# Patient Record
Sex: Female | Born: 1960 | Race: White | Hispanic: No | Marital: Married | Smoking: Never smoker
Health system: Southern US, Community
[De-identification: ages and names within clinical notes are randomized; demographics above are authoritative.]

## PROBLEM LIST (undated history)

## (undated) DIAGNOSIS — I10 Essential (primary) hypertension: Secondary | ICD-10-CM

## (undated) DIAGNOSIS — N200 Calculus of kidney: Secondary | ICD-10-CM

## (undated) HISTORY — DX: Essential (primary) hypertension: I10

## (undated) HISTORY — DX: Calculus of kidney: N20.0

## (undated) HISTORY — PX: GALLBLADDER SURGERY: SHX652

## (undated) HISTORY — PX: ABLATION: SHX5711

## (undated) HISTORY — PX: OTHER SURGICAL HISTORY: SHX169

---

## 2000-10-23 ENCOUNTER — Other Ambulatory Visit: Admission: RE | Admit: 2000-10-23 | Discharge: 2000-10-23 | Payer: Self-pay | Admitting: Internal Medicine

## 2001-08-27 ENCOUNTER — Ambulatory Visit (HOSPITAL_COMMUNITY): Admission: RE | Admit: 2001-08-27 | Discharge: 2001-08-27 | Payer: Self-pay | Admitting: Family Medicine

## 2001-08-27 ENCOUNTER — Encounter: Payer: Self-pay | Admitting: Family Medicine

## 2004-09-20 ENCOUNTER — Emergency Department (HOSPITAL_COMMUNITY): Admission: EM | Admit: 2004-09-20 | Discharge: 2004-09-20 | Payer: Self-pay | Admitting: Emergency Medicine

## 2008-10-14 ENCOUNTER — Ambulatory Visit (HOSPITAL_COMMUNITY): Admission: RE | Admit: 2008-10-14 | Discharge: 2008-10-14 | Payer: Self-pay | Admitting: Obstetrics and Gynecology

## 2008-10-14 ENCOUNTER — Encounter (HOSPITAL_COMMUNITY): Payer: Self-pay | Admitting: Obstetrics and Gynecology

## 2010-03-04 ENCOUNTER — Encounter: Payer: Self-pay | Admitting: Family Medicine

## 2010-05-18 LAB — CBC
HCT: 36.1 % (ref 36.0–46.0)
Hemoglobin: 11.9 g/dL — ABNORMAL LOW (ref 12.0–15.0)
MCHC: 32.9 g/dL (ref 30.0–36.0)
MCV: 85.6 fL (ref 78.0–100.0)
Platelets: 411 10*3/uL — ABNORMAL HIGH (ref 150–400)
RBC: 4.21 MIL/uL (ref 3.87–5.11)
RDW: 15.5 % (ref 11.5–15.5)
WBC: 8.4 10*3/uL (ref 4.0–10.5)

## 2010-05-18 LAB — TYPE AND SCREEN
ABO/RH(D): O POS
Antibody Screen: NEGATIVE

## 2010-05-18 LAB — ABO/RH: ABO/RH(D): O POS

## 2010-05-18 LAB — PREGNANCY, URINE: Preg Test, Ur: NEGATIVE

## 2014-01-05 ENCOUNTER — Other Ambulatory Visit: Payer: Self-pay | Admitting: Obstetrics and Gynecology

## 2014-01-10 LAB — CYTOLOGY - PAP

## 2016-02-01 DIAGNOSIS — J343 Hypertrophy of nasal turbinates: Secondary | ICD-10-CM | POA: Diagnosis not present

## 2016-02-01 DIAGNOSIS — Z6831 Body mass index (BMI) 31.0-31.9, adult: Secondary | ICD-10-CM | POA: Diagnosis not present

## 2016-02-01 DIAGNOSIS — Z1389 Encounter for screening for other disorder: Secondary | ICD-10-CM | POA: Diagnosis not present

## 2016-02-01 DIAGNOSIS — J329 Chronic sinusitis, unspecified: Secondary | ICD-10-CM | POA: Diagnosis not present

## 2016-02-01 DIAGNOSIS — R070 Pain in throat: Secondary | ICD-10-CM | POA: Diagnosis not present

## 2016-02-01 DIAGNOSIS — J069 Acute upper respiratory infection, unspecified: Secondary | ICD-10-CM | POA: Diagnosis not present

## 2016-07-12 ENCOUNTER — Ambulatory Visit (INDEPENDENT_AMBULATORY_CARE_PROVIDER_SITE_OTHER): Payer: BLUE CROSS/BLUE SHIELD | Admitting: Family Medicine

## 2016-07-12 ENCOUNTER — Encounter: Payer: Self-pay | Admitting: Family Medicine

## 2016-07-12 VITALS — BP 134/97 | HR 95 | Temp 98.1°F | Ht 64.5 in | Wt 189.8 lb

## 2016-07-12 DIAGNOSIS — R300 Dysuria: Secondary | ICD-10-CM | POA: Diagnosis not present

## 2016-07-12 DIAGNOSIS — E669 Obesity, unspecified: Secondary | ICD-10-CM | POA: Diagnosis not present

## 2016-07-12 LAB — URINALYSIS, COMPLETE
Bilirubin, UA: NEGATIVE
Glucose, UA: NEGATIVE
Ketones, UA: NEGATIVE
Leukocytes, UA: NEGATIVE
Nitrite, UA: NEGATIVE
Protein, UA: NEGATIVE
Specific Gravity, UA: <1.005 — ABNORMAL LOW (ref 1.005–1.030)
Urobilinogen, Ur: 0.2 mg/dL (ref 0.2–1.0)
pH, UA: 5.5 (ref 5.0–7.5)

## 2016-07-12 LAB — MICROSCOPIC EXAMINATION: Renal Epithel, UA: NONE SEEN /hpf

## 2016-07-12 LAB — BAYER DCA HB A1C WAIVED: HB A1C (BAYER DCA - WAIVED): 5.2 % (ref <7.0)

## 2016-07-12 MED ORDER — SULFAMETHOXAZOLE-TRIMETHOPRIM 800-160 MG PO TABS: 1.0000 | ORAL_TABLET | Freq: Two times a day (BID) | ORAL | 0 refills | Status: DC

## 2016-07-12 NOTE — Patient Instructions (Signed)
Great to see you!  Lets follow up about your microscopic hematuria in 3-4 weeks if the culture is not positive.    Urinary Tract Infection, Adult A urinary tract infection (UTI) is an infection of any part of the urinary tract, which includes the kidneys, ureters, bladder, and urethra. These organs make, store, and get rid of urine in the body. UTI can be a bladder infection (cystitis) or kidney infection (pyelonephritis). What are the causes? This infection may be caused by fungi, viruses, or bacteria. Bacteria are the most common cause of UTIs. This condition can also be caused by repeated incomplete emptying of the bladder during urination. What increases the risk? This condition is more likely to develop if:  You ignore your need to urinate or hold urine for long periods of time.  You do not empty your bladder completely during urination.  You wipe back to front after urinating or having a bowel movement, if you are female.  You are uncircumcised, if you are female.  You are constipated.  You have a urinary catheter that stays in place (indwelling).  You have a weak defense (immune) system.  You have a medical condition that affects your bowels, kidneys, or bladder.  You have diabetes.  You take antibiotic medicines frequently or for long periods of time, and the antibiotics no longer work well against certain types of infections (antibiotic resistance).  You take medicines that irritate your urinary tract.  You are exposed to chemicals that irritate your urinary tract.  You are female.  What are the signs or symptoms? Symptoms of this condition include:  Fever.  Frequent urination or passing small amounts of urine frequently.  Needing to urinate urgently.  Pain or burning with urination.  Urine that smells bad or unusual.  Cloudy urine.  Pain in the lower abdomen or back.  Trouble urinating.  Blood in the urine.  Vomiting or being less hungry than  normal.  Diarrhea or abdominal pain.  Vaginal discharge, if you are female.  How is this diagnosed? This condition is diagnosed with a medical history and physical exam. You will also need to provide a urine sample to test your urine. Other tests may be done, including:  Blood tests.  Sexually transmitted disease (STD) testing.  If you have had more than one UTI, a cystoscopy or imaging studies may be done to determine the cause of the infections. How is this treated? Treatment for this condition often includes a combination of two or more of the following:  Antibiotic medicine.  Other medicines to treat less common causes of UTI.  Over-the-counter medicines to treat pain.  Drinking enough water to stay hydrated.  Follow these instructions at home:  Take over-the-counter and prescription medicines only as told by your health care provider.  If you were prescribed an antibiotic, take it as told by your health care provider. Do not stop taking the antibiotic even if you start to feel better.  Avoid alcohol, caffeine, tea, and carbonated beverages. They can irritate your bladder.  Drink enough fluid to keep your urine clear or pale yellow.  Keep all follow-up visits as told by your health care provider. This is important.  Make sure to: ? Empty your bladder often and completely. Do not hold urine for long periods of time. ? Empty your bladder before and after sex. ? Wipe from front to back after a bowel movement if you are female. Use each tissue one time when you wipe. Contact a health  care provider if:  You have back pain.  You have a fever.  You feel nauseous or vomit.  Your symptoms do not get better after 3 days.  Your symptoms go away and then return. Get help right away if:  You have severe back pain or lower abdominal pain.  You are vomiting and cannot keep down any medicines or water. This information is not intended to replace advice given to you by  your health care provider. Make sure you discuss any questions you have with your health care provider. Document Released: 11/07/2004 Document Revised: 07/12/2015 Document Reviewed: 12/19/2014 Elsevier Interactive Patient Education  2017 [employer].

## 2016-07-12 NOTE — Progress Notes (Signed)
   HPI  Patient presents today here to establish care with a UTI.  Patient explains that for about one week she's had dysuria, suprapubic pressure, urinary frequency.  Over the last 24 hours she began having chills, fatigue, malaise. Also having some nausea.  She's tolerating food and fluid like usual.  Asian previously ran about 6 miles a day. She was told by her GYN that she had microscopic hematuria and was treated for UTI at that time, they believe that it may be due to UTI or running.  No regular exercise Watching diet moderately  PMH: Kidney stones Past surgical history uterine ablation, cystectomy Family history: Mother with hypertension and COPD and thyroid disease, PGM with diabetes, daughter with diabetes Past surgical history, never smoker, no alcohol use, no drug use ROS: Per HPI  Objective: BP (!) 134/97   Pulse 95   Temp 98.1 F (36.7 C) (Oral)   Ht 5' 4.5" (1.638 m)   Wt 189 lb 12.8 oz (86.1 kg)   BMI 32.08 kg/m  Gen: NAD, alert, cooperative with exam HEENT: NCAT, EOMI, PERRL CV: RRR, good S1/S2, no murmur Resp: CTABL, no wheezes, non-labored Abd: Soft, positive bowel sounds, no CVA tenderness, positive suprapubic tenderness and mild tenderness throughout Ext: No edema, warm Neuro: Alert and oriented, No gross deficits  Assessment and plan:  # Dysuria Urinalysis is reassuring today, this is been discussed with patient. Considering that she has convincing symptoms persistently for 7 days with improvement after Azo and she's developing nausea think it's most prudent ago ahead and treat and check a culture to see if she truly had a UTI. Bactrim  # Obesity Discussed therapy last all changes the patient Labs today Patient is fasting except for drinking some Sprite.    Orders Placed This Encounter  Procedures  . Urine culture  . Urinalysis, Complete  . CMP14+EGFR  . CBC with Differential/Platelet  . Lipid panel  . TSH  . Bayer DCA Hb A1c Waived     Meds ordered this encounter  Medications  . sulfamethoxazole-trimethoprim (BACTRIM DS) 800-160 MG tablet    Sig: Take 1 tablet by mouth 2 (two) times daily.    Dispense:  6 tablet    Refill:  0    Sam [name], MD [facility] 07/12/2016, 10:35 AM     

## 2016-07-13 LAB — CBC WITH DIFFERENTIAL/PLATELET
Basophils Absolute: 0 10*3/uL (ref 0.0–0.2)
Basos: 0 %
EOS (ABSOLUTE): 0.2 10*3/uL (ref 0.0–0.4)
Eos: 2 %
Hematocrit: 42 % (ref 34.0–46.6)
Hemoglobin: 14.2 g/dL (ref 11.1–15.9)
Immature Grans (Abs): 0 10*3/uL (ref 0.0–0.1)
Immature Granulocytes: 0 %
Lymphocytes Absolute: 1.6 10*3/uL (ref 0.7–3.1)
Lymphs: 19 %
MCH: 30.7 pg (ref 26.6–33.0)
MCHC: 33.8 g/dL (ref 31.5–35.7)
MCV: 91 fL (ref 79–97)
Monocytes Absolute: 0.7 10*3/uL (ref 0.1–0.9)
Monocytes: 8 %
Neutrophils Absolute: 6.1 10*3/uL (ref 1.4–7.0)
Neutrophils: 71 %
Platelets: 359 10*3/uL (ref 150–379)
RBC: 4.63 x10E6/uL (ref 3.77–5.28)
RDW: 13.7 % (ref 12.3–15.4)
WBC: 8.6 10*3/uL (ref 3.4–10.8)

## 2016-07-13 LAB — CMP14+EGFR
ALT: 16 IU/L (ref 0–32)
AST: 18 IU/L (ref 0–40)
Albumin/Globulin Ratio: 1.4 (ref 1.2–2.2)
Albumin: 4.2 g/dL (ref 3.5–5.5)
Alkaline Phosphatase: 121 IU/L — ABNORMAL HIGH (ref 39–117)
BUN/Creatinine Ratio: 11 (ref 9–23)
BUN: 8 mg/dL (ref 6–24)
Bilirubin Total: 0.5 mg/dL (ref 0.0–1.2)
CO2: 22 mmol/L (ref 18–29)
Calcium: 9 mg/dL (ref 8.7–10.2)
Chloride: 102 mmol/L (ref 96–106)
Creatinine, Ser: 0.71 mg/dL (ref 0.57–1.00)
GFR calc Af Amer: 112 mL/min/{1.73_m2} (ref >59)
GFR calc non Af Amer: 98 mL/min/{1.73_m2} (ref >59)
Globulin, Total: 3 g/dL (ref 1.5–4.5)
Glucose: 96 mg/dL (ref 65–99)
Potassium: 4.6 mmol/L (ref 3.5–5.2)
Sodium: 138 mmol/L (ref 134–144)
Total Protein: 7.2 g/dL (ref 6.0–8.5)

## 2016-07-13 LAB — LIPID PANEL
Chol/HDL Ratio: 4.8 ratio — ABNORMAL HIGH (ref 0.0–4.4)
Cholesterol, Total: 206 mg/dL — ABNORMAL HIGH (ref 100–199)
HDL: 43 mg/dL (ref >39)
LDL Calculated: 151 mg/dL — ABNORMAL HIGH (ref 0–99)
Triglycerides: 60 mg/dL (ref 0–149)
VLDL Cholesterol Cal: 12 mg/dL (ref 5–40)

## 2016-07-13 LAB — TSH: TSH: 0.689 u[IU]/mL (ref 0.450–4.500)

## 2016-07-14 LAB — URINE CULTURE

## 2016-08-20 ENCOUNTER — Encounter: Payer: Self-pay | Admitting: Family Medicine

## 2016-08-20 ENCOUNTER — Ambulatory Visit (INDEPENDENT_AMBULATORY_CARE_PROVIDER_SITE_OTHER): Payer: BLUE CROSS/BLUE SHIELD | Admitting: Family Medicine

## 2016-08-20 VITALS — BP 131/89 | HR 96 | Temp 97.2°F | Ht 64.5 in | Wt 183.6 lb

## 2016-08-20 DIAGNOSIS — R3129 Other microscopic hematuria: Secondary | ICD-10-CM

## 2016-08-20 LAB — URINALYSIS, COMPLETE
Bilirubin, UA: NEGATIVE
Glucose, UA: NEGATIVE
Ketones, UA: NEGATIVE
Nitrite, UA: NEGATIVE
Protein, UA: NEGATIVE
Specific Gravity, UA: 1.01 (ref 1.005–1.030)
Urobilinogen, Ur: 0.2 mg/dL (ref 0.2–1.0)
pH, UA: 5.5 (ref 5.0–7.5)

## 2016-08-20 LAB — MICROSCOPIC EXAMINATION

## 2016-08-20 NOTE — Progress Notes (Signed)
   HPI  Patient presents today here for follow-up hematuria.  Patient has history of recurrent microscopic hematuria. This is been treated as UTI several times. She states that it several times she has not been symptomatic. Currently today she denies any dysuria, urinary frequency, fever, chills, sweats, or other concerning symptoms for UTI.  Her last visit we had a culture that showed more than 2 organisms greater than 100,000 colonies.   PMH: Smoking status noted ROS: Per HPI  Objective: BP 131/89   Pulse 96   Temp (!) 97.2 F (36.2 C) (Oral)   Ht 5' 4.5" (1.638 m)   Wt 183 lb 9.6 oz (83.3 kg)   BMI 31.03 kg/m  Gen: NAD, alert, cooperative with exam HEENT: NCAT CV: RRR, good S1/S2, no murmur Resp: CTABL, no wheezes, non-labored Abdomen: No tenderness to palpation suprapubic area, no CVA tenderness Ext: No edema, warm Neuro: Alert and oriented, No gross deficits  Assessment and plan:  # Microscopic hematuria Asymptomatic, 3-10 rbc's/hpf today. Patient also has positive leukocytes, will send for culture. If negative for multiple colonies again I will go ahead and plan to proceed with plain film to look for renal stone, if that is negative will plan to refer to urology for more complete workup.     Orders Placed This Encounter  Procedures  . Urine Culture  . Urinalysis, Complete     Murtis SinkSam Ceniya Fowers, MD [facility] 08/20/2016, 5:12 PM

## 2016-08-21 LAB — URINE CULTURE

## 2016-08-23 ENCOUNTER — Other Ambulatory Visit: Payer: Self-pay | Admitting: Family Medicine

## 2016-08-23 DIAGNOSIS — R3129 Other microscopic hematuria: Secondary | ICD-10-CM

## 2016-08-29 ENCOUNTER — Telehealth: Payer: Self-pay | Admitting: Family Medicine

## 2016-08-29 DIAGNOSIS — R3129 Other microscopic hematuria: Secondary | ICD-10-CM

## 2016-08-29 NOTE — Telephone Encounter (Signed)
Order placed   Murtis SinkSam Macon Sandiford, MD [facility] 08/29/2016, 2:13 PM

## 2016-08-29 NOTE — Telephone Encounter (Signed)
Left message stating that order has been placed to come in and have xray done.

## 2016-09-14 ENCOUNTER — Telehealth: Payer: Self-pay | Admitting: Family Medicine

## 2016-09-14 NOTE — Telephone Encounter (Signed)
Detailed message left for patient that she may come and have xray done at our office. Xray hours were left on voicemail and that our xray department was not in on Saturday's.

## 2016-09-19 ENCOUNTER — Telehealth: Payer: Self-pay | Admitting: Family Medicine

## 2016-09-19 ENCOUNTER — Other Ambulatory Visit (INDEPENDENT_AMBULATORY_CARE_PROVIDER_SITE_OTHER): Payer: BLUE CROSS/BLUE SHIELD

## 2016-09-19 DIAGNOSIS — R3129 Other microscopic hematuria: Secondary | ICD-10-CM

## 2016-09-19 DIAGNOSIS — N029 Recurrent and persistent hematuria with unspecified morphologic changes: Secondary | ICD-10-CM

## 2016-09-19 NOTE — Telephone Encounter (Signed)
Pt notified of results Verbalizes understanding 

## 2016-10-22 DIAGNOSIS — R3121 Asymptomatic microscopic hematuria: Secondary | ICD-10-CM | POA: Diagnosis not present

## 2016-11-08 DIAGNOSIS — R319 Hematuria, unspecified: Secondary | ICD-10-CM | POA: Diagnosis not present

## 2016-11-08 DIAGNOSIS — R3121 Asymptomatic microscopic hematuria: Secondary | ICD-10-CM | POA: Diagnosis not present

## 2016-11-14 DIAGNOSIS — R3121 Asymptomatic microscopic hematuria: Secondary | ICD-10-CM | POA: Diagnosis not present

## 2017-01-08 ENCOUNTER — Ambulatory Visit: Payer: BLUE CROSS/BLUE SHIELD | Admitting: Physician Assistant

## 2017-01-08 ENCOUNTER — Encounter: Payer: Self-pay | Admitting: Physician Assistant

## 2017-01-08 VITALS — BP 142/89 | HR 87 | Temp 97.9°F | Ht 64.5 in | Wt 194.2 lb

## 2017-01-08 DIAGNOSIS — J189 Pneumonia, unspecified organism: Secondary | ICD-10-CM

## 2017-01-08 DIAGNOSIS — R030 Elevated blood-pressure reading, without diagnosis of hypertension: Secondary | ICD-10-CM | POA: Diagnosis not present

## 2017-01-08 MED ORDER — PREDNISONE 10 MG (21) PO TBPK: ORAL_TABLET | ORAL | 0 refills | Status: DC

## 2017-01-08 MED ORDER — DOXYCYCLINE HYCLATE 100 MG PO TBEC: 100.0000 mg | DELAYED_RELEASE_TABLET | Freq: Two times a day (BID) | ORAL | 1 refills | Status: DC

## 2017-01-08 MED ORDER — HYDROCHLOROTHIAZIDE 25 MG PO TABS: 25.0000 mg | ORAL_TABLET | Freq: Every day | ORAL | 3 refills | Status: DC

## 2017-01-08 MED ORDER — ALBUTEROL SULFATE HFA 108 (90 BASE) MCG/ACT IN AERS: 2.0000 | INHALATION_SPRAY | Freq: Four times a day (QID) | RESPIRATORY_TRACT | 2 refills | Status: DC | PRN

## 2017-01-08 NOTE — Patient Instructions (Signed)
In a few days you may receive a survey in the mail or online from [employer] regarding your visit with us today. Please take a moment to fill this out. Your feedback is very important to our whole office. It can help us better understand your needs as well as improve your experience and satisfaction. Thank you for taking your time to complete it. We care about you.  Dolly Harbach, PA-C  

## 2017-01-09 NOTE — Progress Notes (Signed)
BP (!) 142/89   Pulse 87   Temp 97.9 F (36.6 C) (Oral)   Ht 5' 4.5" (1.638 m)   Wt 194 lb 3.2 oz (88.1 kg)   BMI 32.82 kg/m    Subjective:    Patient ID: Holly Petersen, female    DOB: February 09, 1961, 56 y.o.   MRN: [id number]  HPI: Holly Petersen is a 56 y.o. female presenting on 01/08/2017 for cough congestion for 5 days  Patient with several days of progressing upper respiratory and bronchial symptoms. Initially there was more upper respiratory congestion. This progressed to having significant cough that is productive throughout the day and severe at night. There is occasional wheezing after coughing. Sometimes there is slight dyspnea on exertion. It is productive mucus that is yellow in color. Denies any blood.  BP readings have been elevated several times over the past few months, she is ready to start medication.  Relevant past medical, surgical, family and social history reviewed and updated as indicated. Allergies and medications reviewed and updated.  Past Medical History:  Diagnosis Date  . Kidney stones     Past Surgical History:  Procedure Laterality Date  . ABLATION    . GALLBLADDER SURGERY      Review of Systems  Constitutional: Positive for chills, fatigue and fever. Negative for activity change and appetite change.  HENT: Positive for congestion, postnasal drip and sore throat.   Eyes: Negative.   Respiratory: Positive for cough and wheezing. Negative for shortness of breath.   Cardiovascular: Negative.  Negative for chest pain, palpitations and leg swelling.  Gastrointestinal: Negative.   Genitourinary: Negative.   Musculoskeletal: Negative.   Skin: Negative.   Neurological: Positive for headaches.    Allergies as of 01/08/2017      Reactions   Levaquin [levofloxacin] Shortness Of Breath   Other Shortness Of Breath   Flouroquinolone   Ciprofloxacin       Medication List        Accurate as of 01/08/17 11:59 PM. Always use your most recent med  list.          albuterol 108 (90 Base) MCG/ACT inhaler Commonly known as:  PROVENTIL HFA;VENTOLIN HFA Inhale 2 puffs into the lungs every 6 (six) hours as needed for wheezing or shortness of breath.   doxycycline 100 MG EC tablet Commonly known as:  DORYX Take 1 tablet (100 mg total) by mouth 2 (two) times daily.   hydrochlorothiazide 25 MG tablet Commonly known as:  HYDRODIURIL Take 1 tablet (25 mg total) by mouth daily.   predniSONE 10 MG (21) Tbpk tablet Commonly known as:  STERAPRED UNI-PAK 21 TAB As directed x 6 days          Objective:    BP (!) 142/89   Pulse 87   Temp 97.9 F (36.6 C) (Oral)   Ht 5' 4.5" (1.638 m)   Wt 194 lb 3.2 oz (88.1 kg)   BMI 32.82 kg/m   Allergies  Allergen Reactions  . Levaquin [Levofloxacin] Shortness Of Breath  . Other Shortness Of Breath    Flouroquinolone   . Ciprofloxacin     Physical Exam  Constitutional: She is oriented to person, place, and time. She appears well-developed and well-nourished.  HENT:  Head: Normocephalic and atraumatic.  Right Ear: There is drainage and tenderness.  Left Ear: There is drainage and tenderness.  Nose: Mucosal edema and rhinorrhea present. Right sinus exhibits no maxillary sinus tenderness and no frontal sinus tenderness. Left sinus exhibits  no maxillary sinus tenderness and no frontal sinus tenderness.  Mouth/Throat: Oropharyngeal exudate and posterior oropharyngeal erythema present.  Eyes: Conjunctivae and EOM are normal. Pupils are equal, round, and reactive to light.  Neck: Normal range of motion. Neck supple.  Cardiovascular: Normal rate, regular rhythm, normal heart sounds and intact distal pulses.  Pulmonary/Chest: Effort normal. She has wheezes in the right upper field and the left upper field.  Abdominal: Soft. Bowel sounds are normal.  Neurological: She is alert and oriented to person, place, and time. She has normal reflexes.  Skin: Skin is warm and dry. No rash noted.    Psychiatric: She has a normal mood and affect. Her behavior is normal. Judgment and thought content normal.        Assessment & Plan:   1. Atypical pneumonia - doxycycline (DORYX) 100 MG EC tablet; Take 1 tablet (100 mg total) by mouth 2 (two) times daily.  Dispense: 20 tablet; Refill: 1 - predniSONE (STERAPRED UNI-PAK 21 TAB) 10 MG (21) TBPK tablet; As directed x 6 days  Dispense: 21 tablet; Refill: 0 - albuterol (PROVENTIL HFA;VENTOLIN HFA) 108 (90 Base) MCG/ACT inhaler; Inhale 2 puffs into the lungs every 6 (six) hours as needed for wheezing or shortness of breath.  Dispense: 1 Inhaler; Refill: 2  2. Elevated blood pressure reading - hydrochlorothiazide (HYDRODIURIL) 25 MG tablet; Take 1 tablet (25 mg total) by mouth daily.  Dispense: 90 tablet; Refill: 3    Current Outpatient Medications:  .  albuterol (PROVENTIL HFA;VENTOLIN HFA) 108 (90 Base) MCG/ACT inhaler, Inhale 2 puffs into the lungs every 6 (six) hours as needed for wheezing or shortness of breath., Disp: 1 Inhaler, Rfl: 2 .  doxycycline (DORYX) 100 MG EC tablet, Take 1 tablet (100 mg total) by mouth 2 (two) times daily., Disp: 20 tablet, Rfl: 1 .  hydrochlorothiazide (HYDRODIURIL) 25 MG tablet, Take 1 tablet (25 mg total) by mouth daily., Disp: 90 tablet, Rfl: 3 .  predniSONE (STERAPRED UNI-PAK 21 TAB) 10 MG (21) TBPK tablet, As directed x 6 days, Disp: 21 tablet, Rfl: 0 Continue all other maintenance medications as listed above.  Follow up plan: Return in about 4 weeks (around 02/05/2017), or if symptoms worsen or fail to improve, for recheck on BP [name].  Educational handout given for survey  Remus LofflerAngel S. Tylar Merendino PA-C [facility] [address]  [place], [place] [address] [phone]   01/09/2017, 9:36 PM

## 2017-01-18 ENCOUNTER — Other Ambulatory Visit: Payer: Self-pay | Admitting: Physician Assistant

## 2017-02-14 ENCOUNTER — Ambulatory Visit: Payer: BLUE CROSS/BLUE SHIELD | Admitting: Family Medicine

## 2017-02-14 VITALS — BP 141/89 | HR 78 | Temp 97.1°F | Ht 64.5 in | Wt 199.0 lb

## 2017-02-14 DIAGNOSIS — R030 Elevated blood-pressure reading, without diagnosis of hypertension: Secondary | ICD-10-CM | POA: Diagnosis not present

## 2017-02-14 NOTE — Progress Notes (Signed)
   HPI  Patient presents today here to follow-up for hypertension.  Patient had elevated blood pressure last month, she was started on HCTZ but did not start the medication.  She is interested in losing weight.  She has an exercise plan and has been watching her diet. She is actually eating less than the thousand calories a day and understands that could be detrimental to weight loss.  Her breathing is back to baseline.  PMH: Smoking status noted ROS: Per HPI  Objective: BP (!) 141/89   Pulse 78   Temp (!) 97.1 F (36.2 C) (Oral)   Ht 5' 4.5" (1.638 m)   Wt 199 lb (90.3 kg)   BMI 33.63 kg/m  Gen: NAD, alert, cooperative with exam HEENT: NCAT CV: RRR, good S1/S2, no murmur Resp: CTABL, no wheezes, non-labored Ext: No edema, warm Neuro: Alert and oriented, No gross deficits  Assessment and plan:  #Elevated blood pressure Discussed at length, she will start HCTZ and follow-up in 1-2 months. Also discussed that if she could lose 5-10% of body weight she would likely not necessitate this medication. Discussed healthy weight loss goals    Murtis SinkSam Sharisa Toves, MD [facility] 02/14/2017, 5:02 PM

## 2017-02-28 ENCOUNTER — Encounter: Payer: Self-pay | Admitting: Family Medicine

## 2017-03-13 ENCOUNTER — Ambulatory Visit: Payer: BLUE CROSS/BLUE SHIELD | Admitting: Family Medicine

## 2017-03-13 ENCOUNTER — Encounter: Payer: Self-pay | Admitting: Family Medicine

## 2017-03-13 VITALS — BP 138/86 | HR 98 | Temp 97.6°F | Ht 64.5 in | Wt 185.6 lb

## 2017-03-13 DIAGNOSIS — E669 Obesity, unspecified: Secondary | ICD-10-CM | POA: Diagnosis not present

## 2017-03-13 DIAGNOSIS — R300 Dysuria: Secondary | ICD-10-CM

## 2017-03-13 DIAGNOSIS — R030 Elevated blood-pressure reading, without diagnosis of hypertension: Secondary | ICD-10-CM

## 2017-03-13 DIAGNOSIS — I10 Essential (primary) hypertension: Secondary | ICD-10-CM | POA: Insufficient documentation

## 2017-03-13 LAB — URINALYSIS, COMPLETE
Bilirubin, UA: NEGATIVE
Glucose, UA: NEGATIVE
Leukocytes, UA: NEGATIVE
Nitrite, UA: NEGATIVE
Protein, UA: NEGATIVE
Specific Gravity, UA: <1.005 — ABNORMAL LOW (ref 1.005–1.030)
Urobilinogen, Ur: 0.2 mg/dL (ref 0.2–1.0)
pH, UA: 6 (ref 5.0–7.5)

## 2017-03-13 LAB — MICROSCOPIC EXAMINATION: Renal Epithel, UA: NONE SEEN /hpf

## 2017-03-13 NOTE — Patient Instructions (Signed)
Great to see you!  You have made great progress, congratulations!

## 2017-03-13 NOTE — Progress Notes (Signed)
   HPI  Patient presents today here to follow-up for elevated blood pressure, obesity.  Patient also has dysuria Patient states that she has had dysuria now for about 1 week, is difficult to tell whether it is due to HCTZ with polyuria or UTI. She denies any fever, chills, sweats, she is tolerating food and fluids like usual.  Elevated BP Doing well with HCTZ, blood pressure in the 120s at home Patient had a rash after starting, however did start the keto diet as well, she increased her carbs and the rash resolved.  She is very happy with her weight loss success she is still doing a low-carb diet with a little bit higher daily limit.  Her rash started when she was less than 10 grams carbs a day  PMH: Smoking status noted ROS: Per HPI  Objective: BP 138/86   Pulse 98   Temp 97.6 F (36.4 C) (Oral)   Ht 5' 4.5" (1.638 m)   Wt 185 lb 9.6 oz (84.2 kg)   BMI 31.37 kg/m  Gen: NAD, alert, cooperative with exam HEENT: NCA CV: RRR, good S1/S2, no murmur Resp: CTABL, no wheezes, non-labored Ext: No edema, warm Neuro: Alert and oriented, No gross deficits  Assessment and plan:  #Elevated BP Improved slightly, patient with improved blood pressure at home in the 120s. Continue HCTZ Labs  #Obesity Congratulated on success, she is losing weight has lost 14 pounds in 4 weeks  #Dysuria Urinalysis is reassuring, microscopic testing pending. No signs of UTI otherwise      Orders Placed This Encounter  Procedures  . Urinalysis, Complete  . CMP14+EGFR  . CBC with Differential/Platelet  . Lipid panel  . TSH    Laroy Apple, MD [facility] 03/13/2017, 1:15 PM

## 2017-03-14 LAB — LIPID PANEL
Chol/HDL Ratio: 4.5 ratio — ABNORMAL HIGH (ref 0.0–4.4)
Cholesterol, Total: 220 mg/dL — ABNORMAL HIGH (ref 100–199)
HDL: 49 mg/dL (ref >39)
LDL Calculated: 160 mg/dL — ABNORMAL HIGH (ref 0–99)
Triglycerides: 57 mg/dL (ref 0–149)
VLDL Cholesterol Cal: 11 mg/dL (ref 5–40)

## 2017-03-14 LAB — CBC WITH DIFFERENTIAL/PLATELET
Basophils Absolute: 0.1 10*3/uL (ref 0.0–0.2)
Basos: 1 %
EOS (ABSOLUTE): 0.5 10*3/uL — ABNORMAL HIGH (ref 0.0–0.4)
Eos: 5 %
Hematocrit: 41.1 % (ref 34.0–46.6)
Hemoglobin: 13.8 g/dL (ref 11.1–15.9)
Immature Grans (Abs): 0 10*3/uL (ref 0.0–0.1)
Immature Granulocytes: 0 %
Lymphocytes Absolute: 3.3 10*3/uL — ABNORMAL HIGH (ref 0.7–3.1)
Lymphs: 30 %
MCH: 30.4 pg (ref 26.6–33.0)
MCHC: 33.6 g/dL (ref 31.5–35.7)
MCV: 91 fL (ref 79–97)
Monocytes Absolute: 0.8 10*3/uL (ref 0.1–0.9)
Monocytes: 7 %
Neutrophils Absolute: 6.2 10*3/uL (ref 1.4–7.0)
Neutrophils: 57 %
Platelets: 392 10*3/uL — ABNORMAL HIGH (ref 150–379)
RBC: 4.54 x10E6/uL (ref 3.77–5.28)
RDW: 13.7 % (ref 12.3–15.4)
WBC: 10.9 10*3/uL — ABNORMAL HIGH (ref 3.4–10.8)

## 2017-03-14 LAB — CMP14+EGFR
ALT: 14 IU/L (ref 0–32)
AST: 18 IU/L (ref 0–40)
Albumin/Globulin Ratio: 1.4 (ref 1.2–2.2)
Albumin: 4.1 g/dL (ref 3.5–5.5)
Alkaline Phosphatase: 94 IU/L (ref 39–117)
BUN/Creatinine Ratio: 13 (ref 9–23)
BUN: 8 mg/dL (ref 6–24)
Bilirubin Total: 0.3 mg/dL (ref 0.0–1.2)
CO2: 21 mmol/L (ref 20–29)
Calcium: 8.9 mg/dL (ref 8.7–10.2)
Chloride: 95 mmol/L — ABNORMAL LOW (ref 96–106)
Creatinine, Ser: 0.62 mg/dL (ref 0.57–1.00)
GFR calc Af Amer: 118 mL/min/{1.73_m2} (ref >59)
GFR calc non Af Amer: 103 mL/min/{1.73_m2} (ref >59)
Globulin, Total: 3 g/dL (ref 1.5–4.5)
Glucose: 74 mg/dL (ref 65–99)
Potassium: 3.5 mmol/L (ref 3.5–5.2)
Sodium: 137 mmol/L (ref 134–144)
Total Protein: 7.1 g/dL (ref 6.0–8.5)

## 2017-03-14 LAB — TSH: TSH: 1.55 u[IU]/mL (ref 0.450–4.500)

## 2017-03-28 ENCOUNTER — Ambulatory Visit: Payer: BLUE CROSS/BLUE SHIELD | Admitting: Family Medicine

## 2017-04-15 ENCOUNTER — Encounter: Payer: Self-pay | Admitting: Family Medicine

## 2017-04-15 ENCOUNTER — Ambulatory Visit: Payer: BLUE CROSS/BLUE SHIELD | Admitting: Family Medicine

## 2017-04-15 VITALS — BP 122/85 | HR 72 | Temp 97.2°F | Ht 64.5 in | Wt 177.4 lb

## 2017-04-15 DIAGNOSIS — J0140 Acute pansinusitis, unspecified: Secondary | ICD-10-CM

## 2017-04-15 DIAGNOSIS — R030 Elevated blood-pressure reading, without diagnosis of hypertension: Secondary | ICD-10-CM | POA: Diagnosis not present

## 2017-04-15 MED ORDER — AMOXICILLIN-POT CLAVULANATE 875-125 MG PO TABS: 1.0000 | ORAL_TABLET | Freq: Two times a day (BID) | ORAL | 0 refills | Status: DC

## 2017-04-15 NOTE — Progress Notes (Signed)
   HPI  Patient presents today here with concern for sinus infection.  Reports 5 days of facial pressure, frontal headache, watering eyes with green crusty mattering in the morning. She denies shortness of breath or cough. She is tolerating food and fluids like usual.  Patient also wonders if she could stop her HCTZ, she is lost 15 pounds since starting her current diet. Pressure is frequently in the 1 teens in the morning  PMH: Smoking status noted ROS: Per HPI  Objective: BP 122/85   Pulse 72   Temp (!) 97.2 F (36.2 C) (Oral)   Ht 5' 4.5" (1.638 m)   Wt 177 lb 6.4 oz (80.5 kg)   BMI 29.98 kg/m  Gen: NAD, alert, cooperative with exam HEENT: NCAT, tenderness to palpation of bilateral frontal and maxillary sinuses, maxillary greater than frontal, TMs normal bilaterally, nares crusty but clear,  no conjunctival injection CV: RRR, good S1/S2, no murmur Resp: CTABL, no wheezes, non-labored Ext: No edema, warm Neuro: Alert and oriented, No gross deficits  Assessment and plan:  #Acute pansinusitis Treat with Augmentin Return to clinic with any concerns  #Elevated BP Improved with HCTZ, patient will try trial off after 15 pound weight loss   Meds ordered this encounter  Medications  . amoxicillin-clavulanate (AUGMENTIN) 875-125 MG tablet    Sig: Take 1 tablet by mouth 2 (two) times daily.    Dispense:  20 tablet    Refill:  0    Murtis SinkSam Raeshawn Vo, MD [facility] 04/15/2017, 9:24 AM

## 2017-04-15 NOTE — Patient Instructions (Signed)
Great to see you!   Sinusitis, Adult Sinusitis is soreness and inflammation of your sinuses. Sinuses are hollow spaces in the bones around your face. They are located:  Around your eyes.  In the middle of your forehead.  Behind your nose.  In your cheekbones.  Your sinuses and nasal passages are lined with a stringy fluid (mucus). Mucus normally drains out of your sinuses. When your nasal tissues get inflamed or swollen, the mucus can get trapped or blocked so air cannot flow through your sinuses. This lets bacteria, viruses, and funguses grow, and that leads to infection. Follow these instructions at home: Medicines  Take, use, or apply over-the-counter and prescription medicines only as told by your doctor. These may include nasal sprays.  If you were prescribed an antibiotic medicine, take it as told by your doctor. Do not stop taking the antibiotic even if you start to feel better. Hydrate and Humidify  Drink enough water to keep your pee (urine) clear or pale yellow.  Use a cool mist humidifier to keep the humidity level in your home above 50%.  Breathe in steam for 10-15 minutes, 3-4 times a day or as told by your doctor. You can do this in the bathroom while a hot shower is running.  Try not to spend time in cool or dry air. Rest  Rest as much as possible.  Sleep with your head raised (elevated).  Make sure to get enough sleep each night. General instructions  Put a warm, moist washcloth on your face 3-4 times a day or as told by your doctor. This will help with discomfort.  Wash your hands often with soap and water. If there is no soap and water, use hand sanitizer.  Do not smoke. Avoid being around people who are smoking (secondhand smoke).  Keep all follow-up visits as told by your doctor. This is important. Contact a doctor if:  You have a fever.  Your symptoms get worse.  Your symptoms do not get better within 10 days. Get help right away if:  You  have a very bad headache.  You cannot stop throwing up (vomiting).  You have pain or swelling around your face or eyes.  You have trouble seeing.  You feel confused.  Your neck is stiff.  You have trouble breathing. This information is not intended to replace advice given to you by your health care provider. Make sure you discuss any questions you have with your health care provider. Document Released: 07/17/2007 Document Revised: 09/24/2015 Document Reviewed: 11/23/2014 Elsevier Interactive Patient Education  2018 [employer]  

## 2017-10-10 ENCOUNTER — Ambulatory Visit: Payer: BLUE CROSS/BLUE SHIELD | Admitting: Family Medicine

## 2017-10-10 ENCOUNTER — Encounter: Payer: Self-pay | Admitting: Family Medicine

## 2017-10-10 VITALS — BP 126/85 | HR 75 | Temp 98.4°F | Ht 64.0 in | Wt 175.0 lb

## 2017-10-10 DIAGNOSIS — N3001 Acute cystitis with hematuria: Secondary | ICD-10-CM | POA: Diagnosis not present

## 2017-10-10 LAB — URINALYSIS, COMPLETE
Bilirubin, UA: NEGATIVE
Glucose, UA: NEGATIVE
Ketones, UA: NEGATIVE
Nitrite, UA: NEGATIVE
Protein, UA: NEGATIVE
Specific Gravity, UA: <1.005 — ABNORMAL LOW (ref 1.005–1.030)
Urobilinogen, Ur: 0.2 mg/dL (ref 0.2–1.0)
pH, UA: 5.5 (ref 5.0–7.5)

## 2017-10-10 LAB — MICROSCOPIC EXAMINATION: Renal Epithel, UA: NONE SEEN /hpf

## 2017-10-10 MED ORDER — CEPHALEXIN 500 MG PO CAPS: 500.0000 mg | ORAL_CAPSULE | Freq: Two times a day (BID) | ORAL | 0 refills | Status: AC

## 2017-10-10 NOTE — Patient Instructions (Signed)

## 2017-10-10 NOTE — Progress Notes (Signed)
Subjective: CC: UTI PCP: Elenora GammaBradshaw, Samuel L, MD ZOX:WRUEAVWHPI:Laketra Sandre Holly Petersen is a 57 y.o. female presenting to clinic today for:  1. Urinary symptoms Patient reports a several day h/o dysuria and urinary odor .  She has had some low back pain but no pain over the kidneys.  Denies urgency, hematuria, fevers, chills, abdominal pain, nausea, vomiting, vaginal discharge.  Patient has used AZO for symptoms with good relief but she did not take this today because she needs to be come in and.  Patient denies a h/o frequent or recurrent UTIs.  She does report a history of microscopic hematuria that has been worked up extensively about 6 months ago.  She notes that all her studies were normal.    ROS: Per HPI  Allergies  Allergen Reactions  . Levaquin [Levofloxacin] Shortness Of Breath  . Other Shortness Of Breath    Flouroquinolone   . Ciprofloxacin    Past Medical History:  Diagnosis Date  . Kidney stones     Current Outpatient Medications:  .  cephALEXin (KEFLEX) 500 MG capsule, Take 1 capsule (500 mg total) by mouth 2 (two) times daily for 5 days., Disp: 10 capsule, Rfl: 0 Social History   Socioeconomic History  . Marital status: Married    Spouse name: Not on file  . Number of children: Not on file  . Years of education: Not on file  . Highest education level: Not on file  Occupational History  . Not on file  Social Needs  . Financial resource strain: Not on file  . Food insecurity:    Worry: Not on file    Inability: Not on file  . Transportation needs:    Medical: Not on file    Non-medical: Not on file  Tobacco Use  . Smoking status: Never Smoker  . Smokeless tobacco: Never Used  Substance and Sexual Activity  . Alcohol use: No  . Drug use: No  . Sexual activity: Not on file  Lifestyle  . Physical activity:    Days per week: Not on file    Minutes per session: Not on file  . Stress: Not on file  Relationships  . Social connections:    Talks on phone: Not on file    Gets together: Not on file    Attends religious service: Not on file    Active member of club or organization: Not on file    Attends meetings of clubs or organizations: Not on file    Relationship status: Not on file  . Intimate partner violence:    Fear of current or ex partner: Not on file    Emotionally abused: Not on file    Physically abused: Not on file    Forced sexual activity: Not on file  Other Topics Concern  . Not on file  Social History Narrative  . Not on file   Family History  Problem Relation Age of Onset  . COPD Mother   . Hypertension Mother   . Thyroid disease Mother   . Heart disease Father   . Thyroid disease Brother   . Diabetes Daughter   . Thyroid disease Daughter   . Arthritis Maternal Grandmother   . Diabetes Paternal Grandmother     Objective: Office vital signs reviewed. BP 126/85   Pulse 75   Temp 98.4 F (36.9 C) (Oral)   Ht 5\' 4"  (1.626 m)   Wt 175 lb (79.4 kg)   BMI 30.04 kg/m   Physical Examination:  General: Awake, alert, well nourished, No acute distress GU: Suprapubic tenderness with palpation present.  No CVA tenderness palpation  Assessment/ Plan: 57 y.o. female   1. Acute cystitis with hematuria Patient is afebrile nontoxic-appearing.  Physical exam remarkable for suprapubic tenderness to palpation.  Urinalysis remarkable for 1+ blood, 1+ leukocytes.  Urine microscopy with 11-30 white blood cells, 3-10 red blood cells and moderate bacteria.  This is been sent for urine culture.  She is been placed on Keflex 500 mg p.o. twice daily for the next 5 days.  Home care instructions reviewed.  Reasons for return and emergent evaluation for department discussed.  She will follow-up PRN. - Urinalysis, Complete - Urine Culture   Orders Placed This Encounter  Procedures  . Urine Culture  . Urinalysis, Complete   Meds ordered this encounter  Medications  . cephALEXin (KEFLEX) 500 MG capsule    Sig: Take 1 capsule (500 mg total)  by mouth 2 (two) times daily for 5 days.    Dispense:  10 capsule    Refill:  0     [name], DO [facility] [phone]

## 2017-10-12 LAB — URINE CULTURE

## 2017-11-01 ENCOUNTER — Ambulatory Visit: Payer: BLUE CROSS/BLUE SHIELD | Admitting: Pediatrics

## 2017-11-01 VITALS — BP 129/85 | HR 61 | Temp 97.2°F | Wt 175.8 lb

## 2017-11-01 DIAGNOSIS — R300 Dysuria: Secondary | ICD-10-CM

## 2017-11-01 DIAGNOSIS — N3090 Cystitis, unspecified without hematuria: Secondary | ICD-10-CM | POA: Diagnosis not present

## 2017-11-01 MED ORDER — NITROFURANTOIN MONOHYD MACRO 100 MG PO CAPS: 100.0000 mg | ORAL_CAPSULE | Freq: Two times a day (BID) | ORAL | 0 refills | Status: AC

## 2017-11-01 NOTE — Progress Notes (Signed)
  Subjective:   Patient ID: Holly Petersen, female    DOB: [birth year], 57 y.o.   MRN: [id number] CC: Urinary Tract Infection  HPI: Holly Petersen is a 57 y.o. female   Started having symptoms yesterday.  Having pressure in the suprapubic area.  Having to go to the bathroom frequently, having some urgency.    Symptoms feel similar to UTI she had 3 weeks ago.  Treated with Keflex x 5 days at the time for pansensitive E. coli in urine culture.  Lucila MaineGrandson was born yesterday, she is headed back to hospital today.  Relevant past medical, surgical, family and social history reviewed. Allergies and medications reviewed and updated. Social History   Tobacco Use  Smoking Status Never Smoker  Smokeless Tobacco Never Used   ROS: Per HPI   Objective:    BP 129/85   Pulse 61   Temp (!) 97.2 F (36.2 C) (Oral)   Wt 175 lb 12.8 oz (79.7 kg)   BMI 30.18 kg/m   Wt Readings from Last 3 Encounters:  11/01/17 175 lb 12.8 oz (79.7 kg)  10/10/17 175 lb (79.4 kg)  04/15/17 177 lb 6.4 oz (80.5 kg)    Gen: NAD, alert, cooperative with exam, NCAT EYES: EOMI, no conjunctival injection, or no icterus CV: NRRR, normal S1/S2, no murmur, distal pulses 2+ b/l Resp: CTABL, no wheezes, normal WOB Abd: +BS, soft, mild suprapubic tenderness. no guarding or organomegaly Ext: No edema, warm Neuro: Alert and oriented  Assessment & Plan:  Holly Petersen was seen today for urinary tract infection.  Diagnoses and all orders for this visit:  Cystitis -     nitrofurantoin, macrocrystal-monohydrate, (MACROBID) 100 MG capsule; Take 1 capsule (100 mg total) by mouth 2 (two) times daily for 5 days. -     Urine Culture  Dysuria Urinalysis with trace leukocyte esterase, patient had recently taken Azo which may be affecting results.  Given symptoms we will go ahead and treat with nitrofurantoin as above.  Urine culture negative, will call patient and have her stop antibiotics. -     Urinalysis   Follow up  plan: Return if symptoms worsen or fail to improve. Rex Krasarol Iyona Pehrson, MD [facility]

## 2017-11-02 LAB — URINE CULTURE

## 2017-11-03 LAB — URINALYSIS
Bilirubin, UA: NEGATIVE
Glucose, UA: NEGATIVE
Ketones, UA: NEGATIVE
Nitrite, UA: NEGATIVE
Protein, UA: NEGATIVE
RBC, UA: NEGATIVE
Specific Gravity, UA: 1.015 (ref 1.005–1.030)
Urobilinogen, Ur: 0.2 mg/dL (ref 0.2–1.0)
pH, UA: 7.5 (ref 5.0–7.5)

## 2018-01-13 ENCOUNTER — Encounter: Payer: Self-pay | Admitting: Family

## 2018-01-13 ENCOUNTER — Ambulatory Visit: Payer: BLUE CROSS/BLUE SHIELD | Admitting: Family

## 2018-01-13 VITALS — BP 133/86 | HR 97 | Temp 97.0°F | Ht 64.0 in | Wt 179.8 lb

## 2018-01-13 DIAGNOSIS — R030 Elevated blood-pressure reading, without diagnosis of hypertension: Secondary | ICD-10-CM

## 2018-01-13 DIAGNOSIS — E669 Obesity, unspecified: Secondary | ICD-10-CM | POA: Diagnosis not present

## 2018-01-13 NOTE — Patient Instructions (Addendum)
Hypertension Hypertension, commonly called high blood pressure, is when the force of blood pumping through the arteries is too strong. The arteries are the blood vessels that carry blood from the heart throughout the body. Hypertension forces the heart to work harder to pump blood and may cause arteries to become narrow or stiff. Having untreated or uncontrolled hypertension can cause heart attacks, strokes, kidney disease, and other problems. A blood pressure reading consists of a higher number over a lower number. Ideally, your blood pressure should be below 120/80. The first ("top") number is called the systolic pressure. It is a measure of the pressure in your arteries as your heart beats. The second ("bottom") number is called the diastolic pressure. It is a measure of the pressure in your arteries as the heart relaxes. What are the causes? The cause of this condition is not known. What increases the risk? Some risk factors for high blood pressure are under your control. Others are not. Factors you can change  Smoking.  Having type 2 diabetes mellitus, high cholesterol, or both.  Not getting enough exercise or physical activity.  Being overweight.  Having too much fat, sugar, calories, or salt (sodium) in your diet.  Drinking too much alcohol. Factors that are difficult or impossible to change  Having chronic kidney disease.  Having a family history of high blood pressure.  Age. Risk increases with age.  Race. You may be at higher risk if you are African-American.  Gender. Men are at higher risk than women before age 45. After age 65, women are at higher risk than men.  Having obstructive sleep apnea.  Stress. What are the signs or symptoms? Extremely high blood pressure (hypertensive crisis) may cause:  Headache.  Anxiety.  Shortness of breath.  Nosebleed.  Nausea and vomiting.  Severe chest pain.  Jerky movements you cannot control (seizures).  How is this  diagnosed? This condition is diagnosed by measuring your blood pressure while you are seated, with your arm resting on a surface. The cuff of the blood pressure monitor will be placed directly against the skin of your upper arm at the level of your heart. It should be measured at least twice using the same arm. Certain conditions can cause a difference in blood pressure between your right and left arms. Certain factors can cause blood pressure readings to be lower or higher than normal (elevated) for a short period of time:  When your blood pressure is higher when you are in a health care provider's office than when you are at home, this is called white coat hypertension. Most people with this condition do not need medicines.  When your blood pressure is higher at home than when you are in a health care provider's office, this is called masked hypertension. Most people with this condition may need medicines to control blood pressure.  If you have a high blood pressure reading during one visit or you have normal blood pressure with other risk factors:  You may be asked to return on a different day to have your blood pressure checked again.  You may be asked to monitor your blood pressure at home for 1 week or longer.  If you are diagnosed with hypertension, you may have other blood or imaging tests to help your health care provider understand your overall risk for other conditions. How is this treated? This condition is treated by making healthy lifestyle changes, such as eating healthy foods, exercising more, and reducing your alcohol intake. Your   health care provider may prescribe medicine if lifestyle changes are not enough to get your blood pressure under control, and if:  Your systolic blood pressure is above 130.  Your diastolic blood pressure is above 80.  Your personal target blood pressure may vary depending on your medical conditions, your age, and other factors. Follow these  instructions at home: Eating and drinking  Eat a diet that is high in fiber and potassium, and low in sodium, added sugar, and fat. An example eating plan is called the DASH (Dietary Approaches to Stop Hypertension) diet. To eat this way: ? Eat plenty of fresh fruits and vegetables. Try to fill half of your plate at each meal with fruits and vegetables. ? Eat whole grains, such as whole wheat pasta, brown rice, or whole grain bread. Fill about one quarter of your plate with whole grains. ? Eat or drink low-fat dairy products, such as skim milk or low-fat yogurt. ? Avoid fatty cuts of meat, processed or cured meats, and poultry with skin. Fill about one quarter of your plate with lean proteins, such as fish, chicken without skin, beans, eggs, and tofu. ? Avoid premade and processed foods. These tend to be higher in sodium, added sugar, and fat.  Reduce your daily sodium intake. Most people with hypertension should eat less than 1,500 mg of sodium a day.  Limit alcohol intake to no more than 1 drink a day for nonpregnant women and 2 drinks a day for men. One drink equals 12 oz of beer, 5 oz of wine, or 1 oz of hard liquor. Lifestyle  Work with your health care provider to maintain a healthy body weight or to lose weight. Ask what an ideal weight is for you.  Get at least 30 minutes of exercise that causes your heart to beat faster (aerobic exercise) most days of the week. Activities may include walking, swimming, or biking.  Include exercise to strengthen your muscles (resistance exercise), such as pilates or lifting weights, as part of your weekly exercise routine. Try to do these types of exercises for 30 minutes at least 3 days a week.  Do not use any products that contain nicotine or tobacco, such as cigarettes and e-cigarettes. If you need help quitting, ask your health care provider.  Monitor your blood pressure at home as told by your health care provider.  Keep all follow-up visits as  told by your health care provider. This is important. Medicines  Take over-the-counter and prescription medicines only as told by your health care provider. Follow directions carefully. Blood pressure medicines must be taken as prescribed.  Do not skip doses of blood pressure medicine. Doing this puts you at risk for problems and can make the medicine less effective.  Ask your health care provider about side effects or reactions to medicines that you should watch for. Contact a health care provider if:  You think you are having a reaction to a medicine you are taking.  You have headaches that keep coming back (recurring).  You feel dizzy.  You have swelling in your ankles.  You have trouble with your vision. Get help right away if:  You develop a severe headache or confusion.  You have unusual weakness or numbness.  You feel faint.  You have severe pain in your chest or abdomen.  You vomit repeatedly.  You have trouble breathing. Summary  Hypertension is when the force of blood pumping through your arteries is too strong. If this condition is not   controlled, it may put you at risk for serious complications.  Your personal target blood pressure may vary depending on your medical conditions, your age, and other factors. For most people, a normal blood pressure is less than 120/80.  Hypertension is treated with lifestyle changes, medicines, or a combination of both. Lifestyle changes include weight loss, eating a healthy, low-sodium diet, exercising more, and limiting alcohol. This information is not intended to replace advice given to you by your health care provider. Make sure you discuss any questions you have with your health care provider. Document Released: 01/28/2005 Document Revised: 12/27/2015 Document Reviewed: 12/27/2015 Elsevier Interactive Patient Education  2018 [employer] DASH Eating Plan DASH stands for "Dietary Approaches to Stop Hypertension." The DASH  eating plan is a healthy eating plan that has been shown to reduce high blood pressure (hypertension). It may also reduce your risk for type 2 diabetes, heart disease, and stroke. The DASH eating plan may also help with weight loss. What are tips for following this plan? General guidelines  Avoid eating more than 2,300 mg (milligrams) of salt (sodium) a day. If you have hypertension, you may need to reduce your sodium intake to 1,500 mg a day.  Limit alcohol intake to no more than 1 drink a day for nonpregnant women and 2 drinks a day for men. One drink equals 12 oz of beer, 5 oz of wine, or 1 oz of hard liquor.  Work with your health care provider to maintain a healthy body weight or to lose weight. Ask what an ideal weight is for you.  Get at least 30 minutes of exercise that causes your heart to beat faster (aerobic exercise) most days of the week. Activities may include walking, swimming, or biking.  Work with your health care provider or diet and nutrition specialist (dietitian) to adjust your eating plan to your individual calorie needs. Reading food labels  Check food labels for the amount of sodium per serving. Choose foods with less than 5 percent of the Daily Value of sodium. Generally, foods with less than 300 mg of sodium per serving fit into this eating plan.  To find whole grains, look for the word "whole" as the first word in the ingredient list. Shopping  Buy products labeled as "low-sodium" or "no salt added."  Buy fresh foods. Avoid canned foods and premade or frozen meals. Cooking  Avoid adding salt when cooking. Use salt-free seasonings or herbs instead of table salt or sea salt. Check with your health care provider or pharmacist before using salt substitutes.  Do not fry foods. Cook foods using healthy methods such as baking, boiling, grilling, and broiling instead.  Cook with heart-healthy oils, such as olive, canola, soybean, or sunflower oil. Meal  planning   Eat a balanced diet that includes: ? 5 or more servings of fruits and vegetables each day. At each meal, try to fill half of your plate with fruits and vegetables. ? Up to 6-8 servings of whole grains each day. ? Less than 6 oz of lean meat, poultry, or fish each day. A 3-oz serving of meat is about the same size as a deck of cards. One egg equals 1 oz. ? 2 servings of low-fat dairy each day. ? A serving of nuts, seeds, or beans 5 times each week. ? Heart-healthy fats. Healthy fats called Omega-3 fatty acids are found in foods such as flaxseeds and coldwater fish, like sardines, salmon, and mackerel.  Limit how much you eat of the   following: ? Canned or prepackaged foods. ? Food that is high in trans fat, such as fried foods. ? Food that is high in saturated fat, such as fatty meat. ? Sweets, desserts, sugary drinks, and other foods with added sugar. ? Full-fat dairy products.  Do not salt foods before eating.  Try to eat at least 2 vegetarian meals each week.  Eat more home-cooked food and less restaurant, buffet, and fast food.  When eating at a restaurant, ask that your food be prepared with less salt or no salt, if possible. What foods are recommended? The items listed may not be a complete list. Talk with your dietitian about what dietary choices are best for you. Grains Whole-grain or whole-wheat bread. Whole-grain or whole-wheat pasta. Brown rice. Oatmeal. Quinoa. Bulgur. Whole-grain and low-sodium cereals. Pita bread. Low-fat, low-sodium crackers. Whole-wheat flour tortillas. Vegetables Fresh or frozen vegetables (raw, steamed, roasted, or grilled). Low-sodium or reduced-sodium tomato and vegetable juice. Low-sodium or reduced-sodium tomato sauce and tomato paste. Low-sodium or reduced-sodium canned vegetables. Fruits All fresh, dried, or frozen fruit. Canned fruit in natural juice (without added sugar). Meat and other protein foods Skinless chicken or turkey.  Ground chicken or turkey. Pork with fat trimmed off. Fish and seafood. Egg whites. Dried beans, peas, or lentils. Unsalted nuts, nut butters, and seeds. Unsalted canned beans. Lean cuts of beef with fat trimmed off. Low-sodium, lean deli meat. Dairy Low-fat (1%) or fat-free (skim) milk. Fat-free, low-fat, or reduced-fat cheeses. Nonfat, low-sodium ricotta or cottage cheese. Low-fat or nonfat yogurt. Low-fat, low-sodium cheese. Fats and oils Soft margarine without trans fats. Vegetable oil. Low-fat, reduced-fat, or light mayonnaise and salad dressings (reduced-sodium). Canola, safflower, olive, soybean, and sunflower oils. Avocado. Seasoning and other foods Herbs. Spices. Seasoning mixes without salt. Unsalted popcorn and pretzels. Fat-free sweets. What foods are not recommended? The items listed may not be a complete list. Talk with your dietitian about what dietary choices are best for you. Grains Baked goods made with fat, such as croissants, muffins, or some breads. Dry pasta or rice meal packs. Vegetables Creamed or fried vegetables. Vegetables in a cheese sauce. Regular canned vegetables (not low-sodium or reduced-sodium). Regular canned tomato sauce and paste (not low-sodium or reduced-sodium). Regular tomato and vegetable juice (not low-sodium or reduced-sodium). Pickles. Olives. Fruits Canned fruit in a light or heavy syrup. Fried fruit. Fruit in cream or butter sauce. Meat and other protein foods Fatty cuts of meat. Ribs. Fried meat. Bacon. Sausage. Bologna and other processed lunch meats. Salami. Fatback. Hotdogs. Bratwurst. Salted nuts and seeds. Canned beans with added salt. Canned or smoked fish. Whole eggs or egg yolks. Chicken or turkey with skin. Dairy Whole or 2% milk, cream, and half-and-half. Whole or full-fat cream cheese. Whole-fat or sweetened yogurt. Full-fat cheese. Nondairy creamers. Whipped toppings. Processed cheese and cheese spreads. Fats and oils Butter. Stick  margarine. Lard. Shortening. Ghee. Bacon fat. Tropical oils, such as coconut, palm kernel, or palm oil. Seasoning and other foods Salted popcorn and pretzels. Onion salt, garlic salt, seasoned salt, table salt, and sea salt. Worcestershire sauce. Tartar sauce. Barbecue sauce. Teriyaki sauce. Soy sauce, including reduced-sodium. Steak sauce. Canned and packaged gravies. Fish sauce. Oyster sauce. Cocktail sauce. Horseradish that you find on the shelf. Ketchup. Mustard. Meat flavorings and tenderizers. Bouillon cubes. Hot sauce and Tabasco sauce. Premade or packaged marinades. Premade or packaged taco seasonings. Relishes. Regular salad dressings. Where to find more information:  National Heart, Lung, and Blood Institute: www.nhlbi.nih.gov  American Heart Association: www.heart.org   Summary  The DASH eating plan is a healthy eating plan that has been shown to reduce high blood pressure (hypertension). It may also reduce your risk for type 2 diabetes, heart disease, and stroke.  With the DASH eating plan, you should limit salt (sodium) intake to 2,300 mg a day. If you have hypertension, you may need to reduce your sodium intake to 1,500 mg a day.  When on the DASH eating plan, aim to eat more fresh fruits and vegetables, whole grains, lean proteins, low-fat dairy, and heart-healthy fats.  Work with your health care provider or diet and nutrition specialist (dietitian) to adjust your eating plan to your individual calorie needs. This information is not intended to replace advice given to you by your health care provider. Make sure you discuss any questions you have with your health care provider. Document Released: 01/17/2011 Document Revised: 01/22/2016 Document Reviewed: 01/22/2016 Elsevier Interactive Patient Education  2018 [employer]  

## 2018-01-13 NOTE — Progress Notes (Signed)
   Subjective:    Patient ID: Holly Petersen, female    DOB: 26-Apr-1960, 57 y.o.   MRN: [id number]  Chief Complaint  Patient presents with  . Hypertension   PT presents to the office today with elevated blood pressure readings at home. She states he has had HTN in the past, but lost weight and went on low salt diet and was able to control her BP.   Over the last week her BP has been elevated to 135/92, 146/96, and 147/97. She took one HCTZ 25 mg yesterday, but has not taken any today.  Hypertension  This is a new problem. The current episode started more than 1 month ago. The problem has been waxing and waning since onset. The problem is uncontrolled. Associated symptoms include headaches and malaise/fatigue. Pertinent negatives include no peripheral edema or shortness of breath. The current treatment provides no improvement.      Review of Systems  Constitutional: Positive for malaise/fatigue.  Respiratory: Negative for shortness of breath.   Neurological: Positive for headaches.  All other systems reviewed and are negative.      Objective:   Physical Exam  Constitutional: She is oriented to person, place, and time. She appears well-developed and well-nourished. No distress.  HENT:  Head: Normocephalic and atraumatic.  Right Ear: External ear normal.  Left Ear: External ear normal.  Mouth/Throat: Oropharynx is clear and moist.  Eyes: Pupils are equal, round, and reactive to light.  Neck: Normal range of motion. Neck supple. No thyromegaly present.  Cardiovascular: Normal rate, regular rhythm, normal heart sounds and intact distal pulses.  No murmur heard. Pulmonary/Chest: Effort normal and breath sounds normal. No respiratory distress. She has no wheezes.  Abdominal: Soft. Bowel sounds are normal. She exhibits no distension. There is no tenderness.  Musculoskeletal: Normal range of motion. She exhibits no edema or tenderness.  Neurological: She is alert and oriented to  person, place, and time. She has normal reflexes. No cranial nerve deficit.  Skin: Skin is warm and dry.  Psychiatric: She has a normal mood and affect. Her behavior is normal. Judgment and thought content normal.  Vitals reviewed.     BP 133/86   Pulse 97   Temp (!) 97 F (36.1 C) (Oral)   Ht 5\' 4"  (1.626 m)   Wt 179 lb 12.8 oz (81.6 kg)   BMI 30.86 kg/m      Assessment & Plan:  Holly Petersen comes in today with chief complaint of Hypertension   Diagnosis and orders addressed:  1. Elevated BP without diagnosis of hypertension We will hold any medications at this time since BP is at goal -[employer] diet information given -Exercise encouraged - Stress Management  -Continue current meds -RTO in 1 month for follow up  2. Obesity (BMI 30.0-34.9)  Follow up plan: 1 month for CPE   Jannifer Rodneyhristy Daion Ginsberg, FNP

## 2018-01-19 ENCOUNTER — Ambulatory Visit: Payer: BLUE CROSS/BLUE SHIELD | Admitting: Pediatrics

## 2018-02-27 ENCOUNTER — Ambulatory Visit (INDEPENDENT_AMBULATORY_CARE_PROVIDER_SITE_OTHER): Payer: BLUE CROSS/BLUE SHIELD | Admitting: Family

## 2018-02-27 ENCOUNTER — Encounter: Payer: Self-pay | Admitting: Family

## 2018-02-27 VITALS — BP 134/85 | HR 81 | Temp 97.1°F | Ht 64.0 in | Wt 182.0 lb

## 2018-02-27 DIAGNOSIS — Z1211 Encounter for screening for malignant neoplasm of colon: Secondary | ICD-10-CM | POA: Diagnosis not present

## 2018-02-27 DIAGNOSIS — Z0000 Encounter for general adult medical examination without abnormal findings: Secondary | ICD-10-CM

## 2018-02-27 DIAGNOSIS — Z01419 Encounter for gynecological examination (general) (routine) without abnormal findings: Secondary | ICD-10-CM | POA: Diagnosis not present

## 2018-02-27 DIAGNOSIS — E669 Obesity, unspecified: Secondary | ICD-10-CM

## 2018-02-27 DIAGNOSIS — Z1159 Encounter for screening for other viral diseases: Secondary | ICD-10-CM

## 2018-02-27 DIAGNOSIS — Z0001 Encounter for general adult medical examination with abnormal findings: Secondary | ICD-10-CM | POA: Diagnosis not present

## 2018-02-27 DIAGNOSIS — J019 Acute sinusitis, unspecified: Secondary | ICD-10-CM

## 2018-02-27 DIAGNOSIS — Z23 Encounter for immunization: Secondary | ICD-10-CM

## 2018-02-27 DIAGNOSIS — Z1212 Encounter for screening for malignant neoplasm of rectum: Secondary | ICD-10-CM

## 2018-02-27 LAB — URINALYSIS
Bilirubin, UA: NEGATIVE
Glucose, UA: NEGATIVE
Ketones, UA: NEGATIVE
Nitrite, UA: NEGATIVE
Protein, UA: NEGATIVE
Specific Gravity, UA: <1.005 — ABNORMAL LOW (ref 1.005–1.030)
Urobilinogen, Ur: 0.2 mg/dL (ref 0.2–1.0)
pH, UA: 6 (ref 5.0–7.5)

## 2018-02-27 MED ORDER — AMOXICILLIN-POT CLAVULANATE 875-125 MG PO TABS: 1.0000 | ORAL_TABLET | Freq: Two times a day (BID) | ORAL | 0 refills | Status: DC

## 2018-02-27 NOTE — Addendum Note (Signed)
Addended by: Jannifer RodneyHAWKS, Willys Salvino A on: 02/27/2018 03:24 PM   Modules accepted: Orders

## 2018-02-27 NOTE — Addendum Note (Signed)
Addended by: Almeta Monas on: 02/27/2018 03:23 PM   Modules accepted: Orders

## 2018-02-27 NOTE — Progress Notes (Addendum)
Subjective:    Patient ID: Holly Petersen, female    DOB: Jul 07, 1960, 58 y.o.   MRN: [id number]  Chief Complaint  Patient presents with  . Gynecologic Exam    pap   Pt presents to the office today for CPE with pap.  Gynecologic Exam  The patient's pertinent negatives include no genital lesions, genital odor, vaginal bleeding or vaginal discharge. The patient is experiencing no pain. She uses the rhythm method for contraception.  Sinusitis  This is a new problem. The current episode started 1 to 4 weeks ago. The problem has been waxing and waning since onset. There has been no fever. The pain is mild. Associated symptoms include congestion, sinus pressure and sneezing. The treatment provided mild relief.      Review of Systems  HENT: Positive for congestion, sinus pressure and sneezing.   Genitourinary: Negative for vaginal discharge.  All other systems reviewed and are negative.      Objective:   Physical Exam Vitals signs reviewed.  Constitutional:      General: She is not in acute distress.    Appearance: She is well-developed.  HENT:     Head: Normocephalic and atraumatic.     Right Ear: Tympanic membrane normal.     Left Ear: Tympanic membrane normal.  Eyes:     Pupils: Pupils are equal, round, and reactive to light.  Neck:     Musculoskeletal: Normal range of motion and neck supple.     Thyroid: No thyromegaly.  Cardiovascular:     Rate and Rhythm: Normal rate and regular rhythm.     Heart sounds: Normal heart sounds. No murmur.  Pulmonary:     Effort: Pulmonary effort is normal. No respiratory distress.     Breath sounds: Normal breath sounds. No wheezing.  Chest:     Breasts:        Right: No swelling, bleeding, inverted nipple, mass, nipple discharge, skin change or tenderness.        Left: No swelling, bleeding, inverted nipple, mass, nipple discharge, skin change or tenderness.  Abdominal:     General: Bowel sounds are normal. There is no distension.     Palpations: Abdomen is soft.     Tenderness: There is no abdominal tenderness.  Genitourinary:    General: Normal vulva.     Comments: Bimanual exam- no adnexal masses or tenderness, ovaries nonpalpable   Cervix parous and pink- No discharge  Musculoskeletal: Normal range of motion.        General: No tenderness.  Skin:    General: Skin is warm and dry.  Neurological:     Mental Status: She is alert and oriented to person, place, and time.     Cranial Nerves: No cranial nerve deficit.     Deep Tendon Reflexes: Reflexes are normal and symmetric.  Psychiatric:        Behavior: Behavior normal.        Thought Content: Thought content normal.        Judgment: Judgment normal.       BP 134/85   Pulse 81   Temp (!) 97.1 F (36.2 C) (Oral)   Ht 5' 4"  (1.626 m)   Wt 182 lb (82.6 kg)   BMI 31.24 kg/m      Assessment & Plan:  Charlotta Petersen comes in today with chief complaint of Gynecologic Exam (pap)   Diagnosis and orders addressed:  1. Annual physical exam - CMP14+EGFR - CBC with Differential/Platelet - Lipid panel -  TSH - IGP, Aptima HPV, rfx 16/18,45  2. Encounter for annual routine gynecological examination - Urinalysis - CMP14+EGFR - CBC with Differential/Platelet - IGP, Aptima HPV, rfx 16/18,45  3. Obesity (BMI 30-39.9) - CMP14+EGFR - CBC with Differential/Platelet  4. Obesity (BMI 30.0-34.9) - CMP14+EGFR - CBC with Differential/Platelet  5. Colon cancer screening - Cologuard  6. Screening for malignant neoplasm of the rectum - Cologuard  7. Need for hepatitis C screening test - Hepatitis C antibody   8. Acute sinusitis, recurrence not specified, unspecified location - Take meds as prescribed - Use a cool mist humidifier  -Use saline nose sprays frequently -Force fluids -For any cough or congestion  Use plain Mucinex- regular strength or max strength is fine -For fever or aces or pains- take tylenol or ibuprofen. -Throat lozenges if  help -New toothbrush in 3 days - amoxicillin-clavulanate (AUGMENTIN) 875-125 MG tablet; Take 1 tablet by mouth 2 (two) times daily.  Dispense: 14 tablet; Refill: 0   Labs pending Health Maintenance reviewed Diet and exercise encouraged  Follow up plan: 1 year   [name], FNP

## 2018-02-27 NOTE — Patient Instructions (Signed)

## 2018-02-28 LAB — CMP14+EGFR
ALT: 16 IU/L (ref 0–32)
AST: 15 IU/L (ref 0–40)
Albumin/Globulin Ratio: 1.7 (ref 1.2–2.2)
Albumin: 4.3 g/dL (ref 3.5–5.5)
Alkaline Phosphatase: 105 IU/L (ref 39–117)
BUN/Creatinine Ratio: 18 (ref 9–23)
BUN: 12 mg/dL (ref 6–24)
Bilirubin Total: 0.3 mg/dL (ref 0.0–1.2)
CO2: 21 mmol/L (ref 20–29)
Calcium: 9.4 mg/dL (ref 8.7–10.2)
Chloride: 105 mmol/L (ref 96–106)
Creatinine, Ser: 0.67 mg/dL (ref 0.57–1.00)
GFR calc Af Amer: 114 mL/min/{1.73_m2} (ref >59)
GFR calc non Af Amer: 99 mL/min/{1.73_m2} (ref >59)
Globulin, Total: 2.6 g/dL (ref 1.5–4.5)
Glucose: 82 mg/dL (ref 65–99)
Potassium: 4.3 mmol/L (ref 3.5–5.2)
Sodium: 143 mmol/L (ref 134–144)
Total Protein: 6.9 g/dL (ref 6.0–8.5)

## 2018-02-28 LAB — CBC WITH DIFFERENTIAL/PLATELET
Basophils Absolute: 0.1 10*3/uL (ref 0.0–0.2)
Basos: 1 %
EOS (ABSOLUTE): 0.3 10*3/uL (ref 0.0–0.4)
Eos: 3 %
Hematocrit: 38.6 % (ref 34.0–46.6)
Hemoglobin: 13.1 g/dL (ref 11.1–15.9)
Immature Grans (Abs): 0 10*3/uL (ref 0.0–0.1)
Immature Granulocytes: 0 %
Lymphocytes Absolute: 3.7 10*3/uL — ABNORMAL HIGH (ref 0.7–3.1)
Lymphs: 33 %
MCH: 30.8 pg (ref 26.6–33.0)
MCHC: 33.9 g/dL (ref 31.5–35.7)
MCV: 91 fL (ref 79–97)
Monocytes Absolute: 0.6 10*3/uL (ref 0.1–0.9)
Monocytes: 6 %
Neutrophils Absolute: 6.3 10*3/uL (ref 1.4–7.0)
Neutrophils: 57 %
Platelets: 349 10*3/uL (ref 150–450)
RBC: 4.25 x10E6/uL (ref 3.77–5.28)
RDW: 12.3 % (ref 11.7–15.4)
WBC: 11.1 10*3/uL — ABNORMAL HIGH (ref 3.4–10.8)

## 2018-02-28 LAB — HEPATITIS C ANTIBODY: Hep C Virus Ab: <0.1 s/co ratio (ref 0.0–0.9)

## 2018-02-28 LAB — LIPID PANEL
Chol/HDL Ratio: 4.5 ratio — ABNORMAL HIGH (ref 0.0–4.4)
Cholesterol, Total: 245 mg/dL — ABNORMAL HIGH (ref 100–199)
HDL: 55 mg/dL (ref >39)
LDL Calculated: 181 mg/dL — ABNORMAL HIGH (ref 0–99)
Triglycerides: 43 mg/dL (ref 0–149)
VLDL Cholesterol Cal: 9 mg/dL (ref 5–40)

## 2018-02-28 LAB — TSH: TSH: 1.87 u[IU]/mL (ref 0.450–4.500)

## 2018-03-04 LAB — IGP, APTIMA HPV, RFX 16/18,45: HPV Aptima: NEGATIVE

## 2018-03-05 ENCOUNTER — Other Ambulatory Visit: Payer: Self-pay | Admitting: Family

## 2018-03-05 MED ORDER — ATORVASTATIN CALCIUM 10 MG PO TABS: 10.0000 mg | ORAL_TABLET | Freq: Every day | ORAL | 3 refills | Status: DC

## 2018-03-24 DIAGNOSIS — Z1231 Encounter for screening mammogram for malignant neoplasm of breast: Secondary | ICD-10-CM | POA: Diagnosis not present

## 2018-03-24 LAB — HM MAMMOGRAPHY

## 2018-04-03 ENCOUNTER — Encounter: Payer: Self-pay | Admitting: Physician Assistant

## 2018-04-03 ENCOUNTER — Ambulatory Visit: Payer: BLUE CROSS/BLUE SHIELD | Admitting: Physician Assistant

## 2018-04-03 VITALS — BP 138/93 | HR 90 | Temp 97.6°F | Ht 64.0 in | Wt 187.6 lb

## 2018-04-03 DIAGNOSIS — R52 Pain, unspecified: Secondary | ICD-10-CM | POA: Diagnosis not present

## 2018-04-03 DIAGNOSIS — J0110 Acute frontal sinusitis, unspecified: Secondary | ICD-10-CM | POA: Diagnosis not present

## 2018-04-03 DIAGNOSIS — Z20828 Contact with and (suspected) exposure to other viral communicable diseases: Secondary | ICD-10-CM

## 2018-04-03 LAB — VERITOR FLU A/B WAIVED
Influenza A: NEGATIVE
Influenza B: NEGATIVE

## 2018-04-03 MED ORDER — AMOXICILLIN 500 MG PO CAPS: 1000.0000 mg | ORAL_CAPSULE | Freq: Two times a day (BID) | ORAL | 0 refills | Status: DC

## 2018-04-03 MED ORDER — FLUCONAZOLE 150 MG PO TABS: 150.0000 mg | ORAL_TABLET | Freq: Once | ORAL | 0 refills | Status: AC

## 2018-04-03 NOTE — Progress Notes (Signed)
BP (!) 138/93   Pulse 90   Temp 97.6 F (36.4 C) (Oral)   Ht 5\' 4"  (1.626 m)   Wt 187 lb 9.6 oz (85.1 kg)   BMI 32.20 kg/m    Subjective:    Patient ID: Holly Petersen, female    DOB: [birth year], 58 y.o.   MRN: [id number]  HPI: Holly Petersen is a 58 y.o. female presenting on 04/03/2018 for Headache and Sinusitis  This patient has had many days of sinus headache and postnasal drainage. There is copious drainage at times. Denies any fever at this time. There has been a history of sinus infections in the past.  No history of sinus surgery. There is cough at night. It has become more prevalent in recent days.   Past Medical History:  Diagnosis Date  . Kidney stones    Relevant past medical, surgical, family and social history reviewed and updated as indicated. Interim medical history since our last visit reviewed. Allergies and medications reviewed and updated. DATA REVIEWED: CHART IN EPIC  Family History reviewed for pertinent findings.  Review of Systems  Constitutional: Positive for chills and fatigue. Negative for activity change, appetite change and fever.  HENT: Positive for congestion, postnasal drip, sinus pressure, sinus pain and sore throat.   Eyes: Negative.   Respiratory: Positive for cough. Negative for wheezing.   Cardiovascular: Negative.  Negative for chest pain, palpitations and leg swelling.  Gastrointestinal: Negative.   Genitourinary: Negative.   Musculoskeletal: Negative.   Skin: Negative.   Neurological: Positive for headaches.    Allergies as of 04/03/2018      Reactions   Levaquin [levofloxacin] Shortness Of Breath   Other Shortness Of Breath   Flouroquinolone   Ciprofloxacin       Medication List       Accurate as of April 03, 2018 11:34 AM. Always use your most recent med list.        amoxicillin 500 MG capsule Commonly known as:  AMOXIL Take 2 capsules (1,000 mg total) by mouth 2 (two) times daily.   fluconazole 150 MG  tablet Commonly known as:  DIFLUCAN Take 1 tablet (150 mg total) by mouth once for 1 dose.          Objective:    BP (!) 138/93   Pulse 90   Temp 97.6 F (36.4 C) (Oral)   Ht 5\' 4"  (1.626 m)   Wt 187 lb 9.6 oz (85.1 kg)   BMI 32.20 kg/m   Allergies  Allergen Reactions  . Levaquin [Levofloxacin] Shortness Of Breath  . Other Shortness Of Breath    Flouroquinolone   . Ciprofloxacin     Wt Readings from Last 3 Encounters:  04/03/18 187 lb 9.6 oz (85.1 kg)  02/27/18 182 lb (82.6 kg)  01/13/18 179 lb 12.8 oz (81.6 kg)    Physical Exam Constitutional:      Appearance: She is well-developed.  HENT:     Head: Normocephalic and atraumatic.     Right Ear: Tympanic membrane and external ear normal. No middle ear effusion.     Left Ear: Tympanic membrane and external ear normal.  No middle ear effusion.     Nose: Mucosal edema and rhinorrhea present.     Right Sinus: Maxillary sinus tenderness and frontal sinus tenderness present.     Left Sinus: Maxillary sinus tenderness and frontal sinus tenderness present.     Mouth/Throat:     Pharynx: Uvula midline. Posterior oropharyngeal erythema present.  Eyes:  General:        Right eye: No discharge.        Left eye: No discharge.     Conjunctiva/sclera: Conjunctivae normal.     Pupils: Pupils are equal, round, and reactive to light.  Neck:     Musculoskeletal: Normal range of motion.  Cardiovascular:     Rate and Rhythm: Normal rate and regular rhythm.     Heart sounds: Normal heart sounds.  Pulmonary:     Effort: Pulmonary effort is normal. No respiratory distress.     Breath sounds: Normal breath sounds. No wheezing.  Abdominal:     Palpations: Abdomen is soft.  Lymphadenopathy:     Cervical: No cervical adenopathy.  Skin:    General: Skin is warm and dry.  Neurological:     Mental Status: She is alert and oriented to person, place, and time.         Assessment & Plan:   1. Body aches - Veritor Flu A/B  Waived  2. Acute non-recurrent frontal sinusitis - amoxicillin (AMOXIL) 500 MG capsule; Take 2 capsules (1,000 mg total) by mouth 2 (two) times daily.  Dispense: 40 capsule; Refill: 0 - fluconazole (DIFLUCAN) 150 MG tablet; Take 1 tablet (150 mg total) by mouth once for 1 dose.  Dispense: 1 tablet; Refill: 0  3. Exposure to the flu   Continue all other maintenance medications as listed above.  Follow up plan: No follow-ups on file.  Educational handout given for survey  Remus Loffler PA-C [facility] [address]  [place], [place] [address] [phone]   04/03/2018, 11:34 AM

## 2018-04-13 ENCOUNTER — Encounter: Payer: Self-pay | Admitting: Physician Assistant

## 2018-04-13 ENCOUNTER — Other Ambulatory Visit: Payer: Self-pay | Admitting: Physician Assistant

## 2018-04-13 DIAGNOSIS — J0110 Acute frontal sinusitis, unspecified: Secondary | ICD-10-CM

## 2018-04-13 MED ORDER — AMOXICILLIN 500 MG PO CAPS: 1000.0000 mg | ORAL_CAPSULE | Freq: Two times a day (BID) | ORAL | 0 refills | Status: DC

## 2018-05-01 ENCOUNTER — Telehealth: Payer: Self-pay | Admitting: Family

## 2018-05-01 ENCOUNTER — Other Ambulatory Visit: Payer: Self-pay | Admitting: Physician Assistant

## 2018-05-01 MED ORDER — LORATADINE 10 MG PO TABS: 10.0000 mg | ORAL_TABLET | Freq: Every day | ORAL | 11 refills | Status: DC

## 2018-05-01 MED ORDER — FLUTICASONE PROPIONATE 50 MCG/ACT NA SUSP: 2.0000 | Freq: Every day | NASAL | 6 refills | Status: DC

## 2018-05-01 MED ORDER — DOXYCYCLINE HYCLATE 100 MG PO TABS: 100.0000 mg | ORAL_TABLET | Freq: Two times a day (BID) | ORAL | 0 refills | Status: DC

## 2018-05-01 MED ORDER — PREDNISONE 10 MG (21) PO TBPK: ORAL_TABLET | ORAL | 0 refills | Status: DC

## 2018-05-01 NOTE — Telephone Encounter (Signed)
She had a sinus infection earlier, is this the same?  Are there any changes in the mucus.

## 2018-05-01 NOTE — Telephone Encounter (Signed)
Patient aware.

## 2018-05-01 NOTE — Telephone Encounter (Signed)
I have sent prescriptions for Claritin to be taken 1 daily for allergies since she is having difficulty being outside.  I have also sent Flonase for nasal swelling and allergies.  I have sent a prednisone Dosepak to help with swelling and pressure in the head.  Also doxycycline 100 mg 1 twice daily for 10 days.

## 2018-05-01 NOTE — Telephone Encounter (Signed)
[facility]  SWITCHBOARD  SICK CALL SCREENING   05/01/2018  Holly Petersen    YOM:[id number]    DOB:1960/10/28  Best Contact Telephone Number: [phone]   1. Symptoms: Cough, Nasal drainage, headache, sinus pressure, no fever,   2. Symptom Onset: Worsened since monday  3. Have you traveled any over the past 14 days? No  4.   Have you been in recent contact with someone that has tested positive for COVID-19? No   5.   Which pharmacy would you use today if given a prescription? [facility]   Follow-Up Plan Holly Petersen was informed that this information will be given to a clinical staff member to review and that they should receive a follow-up telephone call within 24 hours. she was advised to call back if she develops any new symptoms or if her current symptoms worsen.   Screened by: Josetta Huddle, LPN

## 2018-08-17 ENCOUNTER — Other Ambulatory Visit: Payer: Self-pay

## 2018-08-17 ENCOUNTER — Other Ambulatory Visit: Payer: Self-pay | Admitting: Internal Medicine

## 2018-08-17 DIAGNOSIS — Z20822 Contact with and (suspected) exposure to covid-19: Secondary | ICD-10-CM

## 2018-08-22 LAB — NOVEL CORONAVIRUS, NAA: SARS-CoV-2, NAA: NOT DETECTED

## 2018-09-28 ENCOUNTER — Encounter: Payer: Self-pay | Admitting: Family

## 2018-09-28 ENCOUNTER — Other Ambulatory Visit: Payer: Self-pay

## 2018-09-28 ENCOUNTER — Ambulatory Visit (INDEPENDENT_AMBULATORY_CARE_PROVIDER_SITE_OTHER): Payer: Managed Care, Other (non HMO) | Admitting: Family

## 2018-09-28 DIAGNOSIS — L255 Unspecified contact dermatitis due to plants, except food: Secondary | ICD-10-CM

## 2018-09-28 MED ORDER — PREDNISONE 10 MG (21) PO TBPK: ORAL_TABLET | ORAL | 0 refills | Status: DC

## 2018-09-28 MED ORDER — TRIAMCINOLONE ACETONIDE 0.5 % EX OINT: 1.0000 "application " | TOPICAL_OINTMENT | Freq: Two times a day (BID) | CUTANEOUS | 0 refills | Status: DC

## 2018-09-28 NOTE — Progress Notes (Signed)
   Virtual Visit via telephone Note Due to COVID-19 pandemic this visit was conducted virtually. This visit type was conducted due to national recommendations for restrictions regarding the COVID-19 Pandemic (e.g. social distancing, sheltering in place) in an effort to limit this patient's exposure and mitigate transmission in our community. All issues noted in this document were discussed and addressed.  A physical exam was not performed with this format.  I connected with Holly Petersen on 09/28/18 at 4:11 pm by telephone and verified that I am speaking with the correct person using two identifiers. Holly Petersen is currently located at work  and no one  is currently with her during visit. The provider, [name], FNP is located in their office at time of visit.  I discussed the limitations, risks, security and privacy concerns of performing an evaluation and management service by telephone and the availability of in person appointments. I also discussed with the patient that there may be a patient responsible charge related to this service. The patient expressed understanding and agreed to proceed.   History and Present Illness:  Rash This is a new problem. The current episode started in the past 7 days. The problem has been waxing and waning since onset. The affected locations include the left foot, left lower leg, right foot and right ankle. The rash is characterized by itchiness and redness. She was exposed to plant contact. Past treatments include anti-itch cream and antihistamine. The treatment provided mild relief.      Review of Systems  Skin: Positive for rash.  All other systems reviewed and are negative.    Observations/Objective: No SOB or distress noted, BP 151/92  Assessment and Plan: 1. Contact dermatitis due to plant Do not scratch -Pt to report any new fever, discharge, increase in erythemas -Wear protective clothing while outside- Long sleeves and long pants  -Take a shower as soon as possible after being outside RTO if symptoms worsen or do not improve  - predniSONE (STERAPRED UNI-PAK 21 TAB) 10 MG (21) TBPK tablet; Use as directed  Dispense: 21 tablet; Refill: 0 - triamcinolone ointment (KENALOG) 0.5 %; Apply 1 application topically 2 (two) times daily.  Dispense: 30 g; Refill: 0     I discussed the assessment and treatment plan with the patient. The patient was provided an opportunity to ask questions and all were answered. The patient agreed with the plan and demonstrated an understanding of the instructions.   The patient was advised to call back or seek an in-person evaluation if the symptoms worsen or if the condition fails to improve as anticipated.  The above assessment and management plan was discussed with the patient. The patient verbalized understanding of and has agreed to the management plan. Patient is aware to call the clinic if symptoms persist or worsen. Patient is aware when to return to the clinic for a follow-up visit. Patient educated on when it is appropriate to go to the emergency department.   Time call ended:  4:27 pm  I provided 16 minutes of non-face-to-face time during this encounter.    [name], FNP

## 2018-09-29 ENCOUNTER — Other Ambulatory Visit: Payer: Self-pay | Admitting: Family

## 2018-09-29 MED ORDER — CEPHALEXIN 500 MG PO CAPS: 500.0000 mg | ORAL_CAPSULE | Freq: Three times a day (TID) | ORAL | 0 refills | Status: DC

## 2019-02-17 ENCOUNTER — Ambulatory Visit: Payer: 59 | Attending: Internal Medicine

## 2019-02-17 ENCOUNTER — Other Ambulatory Visit: Payer: Self-pay

## 2019-02-17 DIAGNOSIS — U071 COVID-19: Secondary | ICD-10-CM | POA: Insufficient documentation

## 2019-02-17 DIAGNOSIS — Z20822 Contact with and (suspected) exposure to covid-19: Secondary | ICD-10-CM

## 2019-02-18 LAB — NOVEL CORONAVIRUS, NAA: SARS-CoV-2, NAA: DETECTED — AB

## 2019-06-09 ENCOUNTER — Emergency Department (HOSPITAL_COMMUNITY): Payer: 59

## 2019-06-09 ENCOUNTER — Emergency Department (HOSPITAL_COMMUNITY)
Admission: EM | Admit: 2019-06-09 | Discharge: 2019-06-09 | Disposition: A | Discharge status: Home / Self Care | Payer: 59 | Attending: Emergency Medicine | Admitting: Emergency Medicine

## 2019-06-09 ENCOUNTER — Encounter (HOSPITAL_COMMUNITY): Payer: Self-pay

## 2019-06-09 ENCOUNTER — Other Ambulatory Visit: Payer: Self-pay

## 2019-06-09 DIAGNOSIS — Y999 Unspecified external cause status: Secondary | ICD-10-CM | POA: Diagnosis not present

## 2019-06-09 DIAGNOSIS — S52591A Other fractures of lower end of right radius, initial encounter for closed fracture: Secondary | ICD-10-CM | POA: Insufficient documentation

## 2019-06-09 DIAGNOSIS — Y929 Unspecified place or not applicable: Secondary | ICD-10-CM | POA: Diagnosis not present

## 2019-06-09 DIAGNOSIS — Y93K1 Activity, walking an animal: Secondary | ICD-10-CM | POA: Insufficient documentation

## 2019-06-09 DIAGNOSIS — W010XXA Fall on same level from slipping, tripping and stumbling without subsequent striking against object, initial encounter: Secondary | ICD-10-CM | POA: Diagnosis not present

## 2019-06-09 DIAGNOSIS — S6991XA Unspecified injury of right wrist, hand and finger(s), initial encounter: Secondary | ICD-10-CM | POA: Diagnosis present

## 2019-06-09 DIAGNOSIS — M79631 Pain in right forearm: Secondary | ICD-10-CM | POA: Diagnosis not present

## 2019-06-09 DIAGNOSIS — S52551A Other extraarticular fracture of lower end of right radius, initial encounter for closed fracture: Secondary | ICD-10-CM

## 2019-06-09 MED ORDER — HYDROCODONE-ACETAMINOPHEN 5-325 MG PO TABS: 1.0000 | ORAL_TABLET | Freq: Four times a day (QID) | ORAL | 0 refills | Status: DC | PRN

## 2019-06-09 NOTE — Discharge Instructions (Signed)
Keep your splint clean and dry. Use the sling for comfort.  Continue using ice and elevation to help with pain and swelling.  You may take the hydrocodone if needed for pain relief, use caution with this medication as it will make you drowsy, do not drive within 4 hours of taking hydrocodone.  You may use ibuprofen as well.

## 2019-06-09 NOTE — ED Provider Notes (Signed)
[facility] EMERGENCY DEPARTMENT Provider Note   CSN: [id number] Arrival date & time: 06/09/19  5053     History Chief Complaint  Patient presents with  . Wrist Pain    Holly Petersen is a 59 y.o. female, right handed, presenting with right wrist pain and swelling after falling backwards while walking her dog. The leash was around her wrist when the injury occurred, the dog jerked forward and she somehow fell backward during the incident.  She describes pain and sensation of "dislocation" at the right wrist with radiation of pain into her proximal forearm.  She denies numbness or pain in her hand or fingers.  She has had no treatments prior to arrival.      The history is provided by the patient.       Past Medical History:  Diagnosis Date  . Kidney stones     Patient Active Problem List   Diagnosis Date Noted  . Elevated BP without diagnosis of hypertension 03/13/2017  . Obesity (BMI 30.0-34.9) 07/12/2016    Past Surgical History:  Procedure Laterality Date  . ABLATION    . GALLBLADDER SURGERY       OB History   No obstetric history on file.     Family History  Problem Relation Age of Onset  . COPD Mother   . Hypertension Mother   . Thyroid disease Mother   . Heart disease Father   . Thyroid disease Brother   . Diabetes Daughter   . Thyroid disease Daughter   . Arthritis Maternal Grandmother   . Diabetes Paternal Grandmother     Social History   Tobacco Use  . Smoking status: Never Smoker  . Smokeless tobacco: Never Used  Substance Use Topics  . Alcohol use: No  . Drug use: No    Home Medications Prior to Admission medications   Medication Sig Start Date End Date Taking? Authorizing Provider  cephALEXin (KEFLEX) 500 MG capsule Take 1 capsule (500 mg total) by mouth 3 (three) times daily. 09/29/18   Sharion Balloon, FNP  HYDROcodone-acetaminophen (NORCO/VICODIN) 5-325 MG tablet Take 1 tablet by mouth every 6 (six) hours as needed for moderate  pain. 06/09/19   Evalee Jefferson, PA-C  loratadine (CLARITIN) 10 MG tablet Take 1 tablet (10 mg total) by mouth daily. 05/01/18   Terald Sleeper, PA-C  predniSONE (STERAPRED UNI-PAK 21 TAB) 10 MG (21) TBPK tablet Use as directed 09/28/18   [name] A, FNP  triamcinolone ointment (KENALOG) 0.5 % Apply 1 application topically 2 (two) times daily. 09/28/18   Sharion Balloon, FNP    Allergies    Levaquin [levofloxacin], Other, and Ciprofloxacin  Review of Systems   Review of Systems  Constitutional: Negative for fever.  Respiratory: Negative.   Cardiovascular: Negative.   Gastrointestinal: Negative.   Musculoskeletal: Positive for arthralgias and joint swelling. Negative for myalgias.  Neurological: Negative for weakness and numbness.    Physical Exam Updated Vital Signs BP 132/85 (BP Location: Left Arm)   Pulse 83   Temp 98.2 F (36.8 C) (Oral)   Resp 18   Ht 5\' 5"  (1.651 m)   Wt 84.4 kg   SpO2 99%   BMI 30.95 kg/m   Physical Exam Constitutional:      Appearance: She is well-developed.  HENT:     Head: Atraumatic.  Cardiovascular:     Pulses:          Radial pulses are 2+ on the right side and 2+ on  the left side.     Comments: Pulses equal bilaterally Musculoskeletal:        General: Swelling and tenderness present. No deformity.     Right wrist: Swelling and bony tenderness present. No deformity or lacerations. Decreased range of motion.     Cervical back: Normal range of motion.     Comments: ttp dorsal right wrist with modest dorsal edema and ttp ulnar wrist.  FROM of fingers, no sensation deficits in hand/fingers.  Tender proximal forearm without edema.  Elbow nontender with FROM.    Skin:    General: Skin is warm and dry.  Neurological:     Mental Status: She is alert.     Sensory: No sensory deficit.     Deep Tendon Reflexes: Reflexes normal.     ED Results / Procedures / Treatments   Labs (all labs ordered are listed, but only abnormal results are  displayed) Labs Reviewed - No data to display  EKG None  Radiology DG Forearm Right  Result Date: 06/09/2019 CLINICAL DATA:  Pain following fall EXAM: RIGHT FOREARM - 2 VIEW COMPARISON:  Right wrist radiographs June 09, 2019 FINDINGS: Frontal and lateral views obtained. There is a transversely oriented fracture of the distal radial metaphysis with alignment near anatomic at the fracture site. No other fracture evident. No dislocation. Joint spaces appear normal. No erosive change. IMPRESSION: Transversely oriented fracture distal radial metaphysis with alignment near anatomic. No other fracture. No dislocation. No evident arthropathy. Electronically Signed   By: Bretta Bang III M.D.   On: 06/09/2019 08:26   DG Wrist Complete Right  Result Date: 06/09/2019 CLINICAL DATA:  Pain following fall EXAM: RIGHT WRIST - COMPLETE 3+ VIEW COMPARISON:  None. FINDINGS: Frontal, oblique, and lateral views were obtained. There is a transversely oriented fracture of the distal radial metaphysis with alignment near anatomic the fracture site. No other fracture evident. No dislocation. Joint spaces appear normal. No erosive change. IMPRESSION: Fracture distal radial metaphysis with alignment near anatomic. No other fracture. No dislocation. No evident arthropathy. Electronically Signed   By: Bretta Bang III M.D.   On: 06/09/2019 08:25    Procedures Procedures (including critical care time)  SPLINT APPLICATION Date/Time: 9:15 AM Authorized by: Burgess Amor Consent: Verbal consent obtained. Risks and benefits: risks, benefits and alternatives were discussed Consent given by: patient Splint applied by: EMT/RN Location details: right wrist Splint type: volar forearm/wrist Supplies used:  webril, splint fiberglass, ace wraps Post-procedure: The splinted body part was neurovascularly unchanged following the procedure. Patient tolerance: Patient tolerated the procedure well with no immediate  complications.     Medications Ordered in ED Medications - No data to display  ED Course  I have reviewed the triage vital signs and the nursing notes.  Pertinent labs & imaging results that were available during my care of the patient were reviewed by me and considered in my medical decision making (see chart for details).    MDM Rules/Calculators/A&P                      Imaging reviewed and discussed with patient.  She was placed in a volar forearm splint and sling provided.  Hydrocodone.  Referral to orthopedics for follow-up care.  Patient has no vascular or neuro deficits on exam. Final Clinical Impression(s) / ED Diagnoses Final diagnoses:  Other closed extra-articular fracture of distal end of right radius, initial encounter    Rx / DC Orders ED Discharge Orders  Ordered    HYDROcodone-acetaminophen (NORCO/VICODIN) 5-325 MG tablet  Every 6 hours PRN,   Status:  Discontinued     06/09/19 0910    HYDROcodone-acetaminophen (NORCO/VICODIN) 5-325 MG tablet  Every 6 hours PRN     06/09/19 0911           Burgess Amor, PA-C 06/09/19 [id number]    Blane Ohara, MD 06/09/19 [phone]

## 2019-06-09 NOTE — ED Triage Notes (Signed)
Pt reports she fell walking her dog this morning.  C/O r wrist pain.  Swelling noted.  Pt has been applying ice.  Radial pulse present, can move fingers and cap refill wnl.

## 2020-01-25 ENCOUNTER — Ambulatory Visit (INDEPENDENT_AMBULATORY_CARE_PROVIDER_SITE_OTHER): Payer: Managed Care, Other (non HMO) | Admitting: Family

## 2020-01-25 ENCOUNTER — Encounter: Payer: Self-pay | Admitting: Family

## 2020-01-25 DIAGNOSIS — J019 Acute sinusitis, unspecified: Secondary | ICD-10-CM

## 2020-01-25 MED ORDER — AMOXICILLIN-POT CLAVULANATE 875-125 MG PO TABS: 1.0000 | ORAL_TABLET | Freq: Two times a day (BID) | ORAL | 0 refills | Status: DC

## 2020-01-25 NOTE — Progress Notes (Signed)
   Virtual Visit via telephone Note Due to COVID-19 pandemic this visit was conducted virtually. This visit type was conducted due to national recommendations for restrictions regarding the COVID-19 Pandemic (e.g. social distancing, sheltering in place) in an effort to limit this patient's exposure and mitigate transmission in our community. All issues noted in this document were discussed and addressed.  A physical exam was not performed with this format.  I connected with Holly Petersen on 01/25/20 at 11:57 AM  by telephone and verified that I am speaking with the correct person using two identifiers. Holly Petersen is currently located at work and no one is currently with her during visit. The provider, Jannifer Rodney, FNP is located in their office at time of visit.  I discussed the limitations, risks, security and privacy concerns of performing an evaluation and management service by telephone and the availability of in person appointments. I also discussed with the patient that there may be a patient responsible charge related to this service. The patient expressed understanding and agreed to proceed.   History and Present Illness:  Sinusitis This is a new problem. The current episode started 1 to 4 weeks ago. The problem has been waxing and waning since onset. There has been no fever. Her pain is at a severity of 7/10. The pain is mild. Associated symptoms include congestion, coughing, headaches, a hoarse voice, sinus pressure and sneezing. Past treatments include oral decongestants. The treatment provided mild relief.      Review of Systems  HENT: Positive for congestion, hoarse voice, sinus pressure and sneezing.   Respiratory: Positive for cough.   Neurological: Positive for headaches.  All other systems reviewed and are negative.    Observations/Objective: No SOB or distress noted, congestion noted  Assessment and Plan: 1. Acute sinusitis, recurrence not specified, unspecified  location - Take meds as prescribed - Use a cool mist humidifier  -Use saline nose sprays frequently -Force fluids -For any cough or congestion  Use plain Mucinex- regular strength or max strength is fine -For fever or aces or pains- take tylenol or ibuprofen. -Throat lozenges if help -RTO if symptoms worsen or do not improve  - amoxicillin-clavulanate (AUGMENTIN) 875-125 MG tablet; Take 1 tablet by mouth 2 (two) times daily.  Dispense: 14 tablet; Refill: 0       I discussed the assessment and treatment plan with the patient. The patient was provided an opportunity to ask questions and all were answered. The patient agreed with the plan and demonstrated an understanding of the instructions.   The patient was advised to call back or seek an in-person evaluation if the symptoms worsen or if the condition fails to improve as anticipated.  The above assessment and management plan was discussed with the patient. The patient verbalized understanding of and has agreed to the management plan. Patient is aware to call the clinic if symptoms persist or worsen. Patient is aware when to return to the clinic for a follow-up visit. Patient educated on when it is appropriate to go to the emergency department.   Time call ended:  12:08 pm   I provided 11 minutes of non-face-to-face time during this encounter.    Jannifer Rodney, FNP

## 2020-06-16 ENCOUNTER — Ambulatory Visit
Admission: EM | Admit: 2020-06-16 | Discharge: 2020-06-16 | Disposition: A | Discharge status: Home / Self Care | Payer: 59 | Attending: Emergency Medicine | Admitting: Emergency Medicine

## 2020-06-16 ENCOUNTER — Other Ambulatory Visit: Payer: Self-pay

## 2020-06-16 DIAGNOSIS — R21 Rash and other nonspecific skin eruption: Secondary | ICD-10-CM | POA: Diagnosis not present

## 2020-06-16 DIAGNOSIS — W57XXXA Bitten or stung by nonvenomous insect and other nonvenomous arthropods, initial encounter: Secondary | ICD-10-CM | POA: Diagnosis not present

## 2020-06-16 DIAGNOSIS — S1096XA Insect bite of unspecified part of neck, initial encounter: Secondary | ICD-10-CM

## 2020-06-16 MED ORDER — DOXYCYCLINE HYCLATE 100 MG PO CAPS
100.0000 mg | ORAL_CAPSULE | Freq: Two times a day (BID) | ORAL | 0 refills | Status: AC
Start: 2020-06-16 — End: 2020-06-26

## 2020-06-16 NOTE — ED Triage Notes (Signed)
Pt had tick bite earlier in week and was given 200 mg doxycycline and then bite became more red and inflamed

## 2020-06-16 NOTE — ED Provider Notes (Signed)
,HPI  SUBJECTIVE:  Holly Petersen is a 60 y.o. female who presents with a painful, burning stinging area of erythema on her right neck after removing a tick 4 days ago.  She states it was a deer tick and thinks that it was on for 2 weeks.  She states that it looks like it is getting better.  No nausea, vomiting, fevers, body aches, flulike symptoms, chest pain, shortness of breath, rash elsewhere, joint aches, swelling.  No rash on the palms of her hands or soles of her feet.  She reports bilateral axillary tenderness, but states that this has resolved.  She was given 200 mg of doxycycline x1 with improvement in her symptoms.  No alleviating factors.  Past medical history negative for Lyme, RMSF, diabetes.  PMD: Ignacia Bayley.   Past Medical History:  Diagnosis Date  . Kidney stones     Past Surgical History:  Procedure Laterality Date  . ABLATION    . GALLBLADDER SURGERY      Family History  Problem Relation Age of Onset  . COPD Mother   . Hypertension Mother   . Thyroid disease Mother   . Heart disease Father   . Thyroid disease Brother   . Diabetes Daughter   . Thyroid disease Daughter   . Arthritis Maternal Grandmother   . Diabetes Paternal Grandmother     Social History   Tobacco Use  . Smoking status: Never Smoker  . Smokeless tobacco: Never Used  Vaping Use  . Vaping Use: Never used  Substance Use Topics  . Alcohol use: No  . Drug use: No    No current facility-administered medications for this encounter.  Current Outpatient Medications:  .  amoxicillin-clavulanate (AUGMENTIN) 875-125 MG tablet, Take 1 tablet by mouth 2 (two) times daily., Disp: 14 tablet, Rfl: 0 .  cephALEXin (KEFLEX) 500 MG capsule, Take 1 capsule (500 mg total) by mouth 3 (three) times daily., Disp: 30 capsule, Rfl: 0 .  HYDROcodone-acetaminophen (NORCO/VICODIN) 5-325 MG tablet, Take 1 tablet by mouth every 6 (six) hours as needed for moderate pain., Disp: 20 tablet, Rfl: 0 .   loratadine (CLARITIN) 10 MG tablet, Take 1 tablet (10 mg total) by mouth daily., Disp: 30 tablet, Rfl: 11 .  triamcinolone ointment (KENALOG) 0.5 %, Apply 1 application topically 2 (two) times daily., Disp: 30 g, Rfl: 0  Allergies  Allergen Reactions  . Levaquin [Levofloxacin] Shortness Of Breath  . Other Shortness Of Breath    Flouroquinolone   . Ciprofloxacin      ROS  As noted in HPI.   Physical Exam  BP (!) 148/88   Pulse 87   Temp 98 F (36.7 C)   Resp 18   SpO2 98%   Constitutional: Well developed, well nourished, no acute distress Eyes:  EOMI, conjunctiva normal bilaterally HENT: Normocephalic, atraumatic,mucus membranes moist Respiratory: Normal inspiratory effort Cardiovascular: Normal rate GI: Nondistended Skin: 5 by 1 .5 cm tender area of erythema with central bite mark right neck.  No bull's-eye rash.  No rash on the palms of the hands or elsewhere.  Marked area of erythema with a marker for reference.    Lymph: No postauricular, cervical lymphadenopathy.  Bilateral axillary tenderness, no appreciable palpable lymph nodes. Musculoskeletal: no deformities Neurologic: Alert & oriented x 3, no focal neuro deficits Psychiatric: Speech and behavior appropriate   ED Course   Medications - No data to display  Orders Placed This Encounter  Procedures  . B. burgdorfi antibodies  Standing Status:   Standing    Number of Occurrences:   1  . [facility] spotted fvr abs pnl(IgG+IgM)    Standing Status:   Standing    Number of Occurrences:   1    No results found for this or any previous visit (from the past 24 hour(s)). No results found.  ED Clinical Impression  1. Tick bite of neck, initial encounter   2. Rash and nonspecific skin eruption      ED Assessment/Plan  Suspect secondary cellulitis post tick bite.  Sending off [facility] spotted fever, Lyme titers.  Will send home on 10 days of doxycycline.  She may discontinue this after 5 days if her  labs are negative, symptoms have resolved.  Otherwise, she will need to finish the doxycycline and we may need to extend it out another 11 days if her Lyme titer come back positive.  Keep clean with soap and water.  Follow-up with PMD as needed.  Discussed labs, MDM, treatment plan, and plan for follow-up with patient.  patient agrees with plan.   No orders of the defined types were placed in this encounter.     *This clinic note was created using Dragon dictation software. Therefore, there may be occasional mistakes despite careful proofreading.  ?    Domenick Gong, MD 06/16/20 [phone]

## 2020-06-16 NOTE — Discharge Instructions (Addendum)
Your [facility] spotted fever and Lyme titers will be back in several days.  If they are negative and you are feeling better, you can stop the doxycycline after taking it for 5 days.  I am putting you on this for a cellulitis in addition to possible tickborne infection.  Keep this clean with soap and water.

## 2020-06-19 ENCOUNTER — Ambulatory Visit (INDEPENDENT_AMBULATORY_CARE_PROVIDER_SITE_OTHER): Payer: Managed Care, Other (non HMO) | Admitting: Family

## 2020-06-19 ENCOUNTER — Encounter: Payer: Self-pay | Admitting: Family

## 2020-06-19 NOTE — Progress Notes (Signed)
PT went to the Urgent Care on 06/16/20. Will close visit.   Jannifer Rodney, FNP

## 2020-06-20 LAB — ROCKY MTN SPOTTED FVR ABS PNL(IGG+IGM)
RMSF IgG: NEGATIVE
RMSF IgM: 0.28 index (ref 0.00–0.89)

## 2020-06-21 LAB — LYME DISEASE SEROLOGY W/REFLEX: Lyme Total Antibody EIA: NEGATIVE

## 2020-06-21 LAB — SPECIMEN STATUS REPORT

## 2020-11-01 ENCOUNTER — Encounter: Payer: Self-pay | Admitting: Family Medicine

## 2020-11-01 ENCOUNTER — Ambulatory Visit (INDEPENDENT_AMBULATORY_CARE_PROVIDER_SITE_OTHER): Payer: Managed Care, Other (non HMO) | Admitting: Family Medicine

## 2020-11-01 DIAGNOSIS — J0100 Acute maxillary sinusitis, unspecified: Secondary | ICD-10-CM | POA: Diagnosis not present

## 2020-11-01 DIAGNOSIS — H1013 Acute atopic conjunctivitis, bilateral: Secondary | ICD-10-CM | POA: Diagnosis not present

## 2020-11-01 MED ORDER — OLOPATADINE HCL 0.1 % OP SOLN: 1.0000 [drp] | Freq: Two times a day (BID) | OPHTHALMIC | 0 refills | Status: DC

## 2020-11-01 NOTE — Progress Notes (Signed)
Virtual Visit via MyChart Video Note Due to COVID-19 pandemic this visit was conducted virtually. This visit type was conducted due to national recommendations for restrictions regarding the COVID-19 Pandemic (e.g. social distancing, sheltering in place) in an effort to limit this patient's exposure and mitigate transmission in our community. All issues noted in this document were discussed and addressed.  A physical exam was not performed with this format.   I connected with Holly Petersen on 11/01/2020 at 1030 by MyChart Video and verified that I am speaking with the correct person using two identifiers. [name] is currently located at home and  no one  is currently with them during visit. The provider, Kari Baars, FNP is located in their office at time of visit.  I discussed the limitations, risks, security and privacy concerns of performing an evaluation and management service by video and the availability of in person appointments. I also discussed with the patient that there may be a patient responsible charge related to this service. The patient expressed understanding and agreed to proceed.  Subjective:  Patient ID: Holly Petersen, female    DOB: 15-Dec-1960, 60 y.o.   MRN: [id number]  Chief Complaint:  Sinus Problem and Eye Drainage   HPI: Holly Petersen is a 60 y.o. female presenting on 11/01/2020 for Sinus Problem and Eye Drainage   Pt reports she woke up this morning with right eye crusting, bilateral eye watering and itching. She also reports maxillary sinus pressure which started this morning also. No fever or chills. No known sick contacts.   Sinus Problem This is a new problem. The current episode started today. The problem is unchanged. There has been no fever. Her pain is at a severity of 2/10. The pain is mild. Associated symptoms include congestion, sinus pressure and sneezing. Pertinent negatives include no chills, coughing, diaphoresis, ear pain, headaches, hoarse  voice, neck pain, shortness of breath, sore throat or swollen glands. Past treatments include nothing. The treatment provided no relief.    Relevant past medical, surgical, family, and social history reviewed and updated as indicated.  Allergies and medications reviewed and updated.   Past Medical History:  Diagnosis Date   Kidney stones     Past Surgical History:  Procedure Laterality Date   ABLATION     GALLBLADDER SURGERY      Social History   Socioeconomic History   Marital status: Married    Spouse name: Not on file   Number of children: Not on file   Years of education: Not on file   Highest education level: Not on file  Occupational History   Not on file  Tobacco Use   Smoking status: Never   Smokeless tobacco: Never  Vaping Use   Vaping Use: Never used  Substance and Sexual Activity   Alcohol use: No   Drug use: No   Sexual activity: Not on file  Other Topics Concern   Not on file  Social History Narrative   Not on file   Social Determinants of Health   Financial Resource Strain: Not on file  Food Insecurity: Not on file  Transportation Needs: Not on file  Physical Activity: Not on file  Stress: Not on file  Social Connections: Not on file  Intimate Partner Violence: Not on file    Outpatient Encounter Medications as of 11/01/2020  Medication Sig   olopatadine (PATADAY) 0.1 % ophthalmic solution Place 1 drop into both eyes 2 (two) times daily.   loratadine (CLARITIN) 10 MG  tablet Take 1 tablet (10 mg total) by mouth daily.   triamcinolone ointment (KENALOG) 0.5 % Apply 1 application topically 2 (two) times daily.   No facility-administered encounter medications on file as of 11/01/2020.    Allergies  Allergen Reactions   Levaquin [Levofloxacin] Shortness Of Breath   Other Shortness Of Breath    Flouroquinolone    Ciprofloxacin     Review of Systems  Constitutional:  Negative for activity change, appetite change, chills, diaphoresis,  fatigue, fever and unexpected weight change.  HENT:  Positive for congestion, postnasal drip, rhinorrhea, sinus pressure, sinus pain and sneezing. Negative for dental problem, drooling, ear discharge, ear pain, facial swelling, hearing loss, hoarse voice, mouth sores, nosebleeds, sore throat, tinnitus, trouble swallowing and voice change.   Eyes:  Positive for discharge, redness and itching. Negative for photophobia, pain and visual disturbance.  Respiratory:  Negative for cough, chest tightness and shortness of breath.   Cardiovascular:  Negative for chest pain, palpitations and leg swelling.  Gastrointestinal:  Negative for abdominal pain, blood in stool, constipation, diarrhea, nausea and vomiting.  Endocrine: Negative.   Genitourinary:  Negative for decreased urine volume, difficulty urinating, dysuria, frequency and urgency.  Musculoskeletal:  Negative for arthralgias, myalgias and neck pain.  Skin: Negative.   Allergic/Immunologic: Negative.   Neurological:  Negative for dizziness, tremors, seizures, syncope, facial asymmetry, speech difficulty, weakness, light-headedness, numbness and headaches.  Hematological: Negative.   Psychiatric/Behavioral:  Negative for confusion, hallucinations, sleep disturbance and suicidal ideas.   All other systems reviewed and are negative.       Observations/Objective: No vital signs or physical exam as this was a telephone or virtual health encounter.  Pt alert and oriented, answers all questions appropriately, and able to speak in full sentences.  No acute distress noted. Bilateral eye watering noted, no significant redness noted on video, no purulent drainage noted in video.    Assessment and Plan: Rhena was seen today for sinus problem and eye drainage.  Diagnoses and all orders for this visit:  Allergic conjunctivitis of both eyes No indications of acute bacterial infection. Likely allergic conjunctivitis. Pataday as prescribed. Pt aware to  report any new, worsening, or persistent symptoms. -     olopatadine (PATADAY) 0.1 % ophthalmic solution; Place 1 drop into both eyes 2 (two) times daily.  Acute non-recurrent maxillary sinusitis Onset this morning. No indications of acute bacterial infection. Symptomatic care discussed in detail: Flonase, Zyrtec, increase water intake, Mucinex, and tylenol/motrin as needed for pain. Pt aware of symptoms which require reevaluation.    Follow Up Instructions: Return if symptoms worsen or fail to improve.    I discussed the assessment and treatment plan with the patient. The patient was provided an opportunity to ask questions and all were answered. The patient agreed with the plan and demonstrated an understanding of the instructions.   The patient was advised to call back or seek an in-person evaluation if the symptoms worsen or if the condition fails to improve as anticipated.  The above assessment and management plan was discussed with the patient. The patient verbalized understanding of and has agreed to the management plan. Patient is aware to call the clinic if they develop any new symptoms or if symptoms persist or worsen. Patient is aware when to return to the clinic for a follow-up visit. Patient educated on when it is appropriate to go to the emergency department.    I provided 15 minutes of video-face-to-face time during this encounter. The video  started at 1030. The call ended at 1045. The other time was used for coordination of care.    Kari Baars, FNP-C [facility] [address] [place], [place] [address] [phone] 11/01/2020

## 2021-01-06 ENCOUNTER — Telehealth: Payer: 59 | Admitting: Emergency Medicine

## 2021-01-06 DIAGNOSIS — Z20828 Contact with and (suspected) exposure to other viral communicable diseases: Secondary | ICD-10-CM | POA: Diagnosis not present

## 2021-01-06 DIAGNOSIS — R6889 Other general symptoms and signs: Secondary | ICD-10-CM | POA: Diagnosis not present

## 2021-01-06 MED ORDER — BENZONATATE 100 MG PO CAPS
100.0000 mg | ORAL_CAPSULE | Freq: Two times a day (BID) | ORAL | 0 refills | Status: DC | PRN
Start: 2021-01-06 — End: 2021-05-30

## 2021-01-06 MED ORDER — OSELTAMIVIR PHOSPHATE 75 MG PO CAPS
75.0000 mg | ORAL_CAPSULE | Freq: Two times a day (BID) | ORAL | 0 refills | Status: AC
Start: 2021-01-06 — End: 2021-01-11

## 2021-01-06 NOTE — Progress Notes (Signed)
Virtual Visit Consent   Holly Petersen, you are scheduled for a virtual visit with a [facility] provider today.     Just as with appointments in the office, your consent must be obtained to participate.  Your consent will be active for this visit and any virtual visit you may have with one of our providers in the next 365 days.     If you have a MyChart account, a copy of this consent can be sent to you electronically.  All virtual visits are billed to your insurance company just like a traditional visit in the office.    As this is a virtual visit, video technology does not allow for your provider to perform a traditional examination.  This may limit your provider's ability to fully assess your condition.  If your provider identifies any concerns that need to be evaluated in person or the need to arrange testing (such as labs, EKG, etc.), we will make arrangements to do so.     Although advances in technology are sophisticated, we cannot ensure that it will always work on either your end or our end.  If the connection with a video visit is poor, the visit may have to be switched to a telephone visit.  With either a video or telephone visit, we are not always able to ensure that we have a secure connection.     I need to obtain your verbal consent now.   Are you willing to proceed with your visit today?    Holly Petersen has provided verbal consent on 01/06/2021 for a virtual visit (video or telephone).   Rennis Harding, [place]   Date: 01/06/2021 10:25 AM   Virtual Visit via Video Note   I, Rennis Harding, connected with  Holly Petersen  ([id number], 1960-11-12) on 01/06/21 at 10:30 AM EST by a video-enabled telemedicine application and verified that I am speaking with the correct person using two identifiers.  Location: Patient: Virtual Visit Location Patient: Home Provider: Virtual Visit Location Provider: Home Office   I discussed the limitations of evaluation and management by  telemedicine and the availability of in person appointments. The patient expressed understanding and agreed to proceed.    History of Present Illness: Holly Petersen is a 60 y.o. who identifies as a female who was assigned female at birth, and is being seen today for cough, ear pain, congestion, body aches, and fatigue x 1 day.  Admits to flu exposure.  Has tried OTC medication without relief.  Denies aggravating factors.  Reports previous symptoms in the past.   Denies fever, SOB, wheezing, chest pain, nausea, vomiting, changes in bowel or bladder habits.    ROS: As per HPI.  All other pertinent ROS negative.      HPI: HPI  Problems:  Patient Active Problem List   Diagnosis Date Noted   Elevated BP without diagnosis of hypertension 03/13/2017   Obesity (BMI 30.0-34.9) 07/12/2016    Allergies:  Allergies  Allergen Reactions   Levaquin [Levofloxacin] Shortness Of Breath   Other Shortness Of Breath    Flouroquinolone    Ciprofloxacin    Medications:  Current Outpatient Medications:    benzonatate (TESSALON) 100 MG capsule, Take 1 capsule (100 mg total) by mouth 2 (two) times daily as needed for cough., Disp: 20 capsule, Rfl: 0   oseltamivir (TAMIFLU) 75 MG capsule, Take 1 capsule (75 mg total) by mouth 2 (two) times daily for 5 days., Disp: 10 capsule, Rfl: 0   loratadine (CLARITIN)  10 MG tablet, Take 1 tablet (10 mg total) by mouth daily., Disp: 30 tablet, Rfl: 11   olopatadine (PATADAY) 0.1 % ophthalmic solution, Place 1 drop into both eyes 2 (two) times daily., Disp: 5 mL, Rfl: 0   triamcinolone ointment (KENALOG) 0.5 %, Apply 1 application topically 2 (two) times daily., Disp: 30 g, Rfl: 0  Observations/Objective: Patient is well-developed, well-nourished in no acute distress.  Resting comfortably at home. Fatigued appearing, but nontoxic.  Walking around home during video visit Head is normocephalic, atraumatic.  No labored breathing. Cough present, speaking in full sentences  and tolerating own secretions Speech is clear and coherent with logical content.  Patient is alert and oriented at baseline.   Assessment and Plan: 1. Flu-like symptoms  2. Exposure to the flu Get plenty of rest and push fluids Tamilfu prescribed Tesslaon perles prescribed.  Use as needed for cough Use OTC medications like ibuprofen or tylenol as needed fever or pain Follow up with PCP in 1-2 days via phone or e-visit for recheck and to ensure symptoms are improving Follow up in person with PCP, urgent care or ER/ED if you have any new or worsening symptoms such as fever, worsening cough, shortness of breath, chest tightness, chest pain, turning blue, changes in mental status, etc...    Follow Up Instructions: I discussed the assessment and treatment plan with the patient. The patient was provided an opportunity to ask questions and all were answered. The patient agreed with the plan and demonstrated an understanding of the instructions.  A copy of instructions were sent to the patient via MyChart unless otherwise noted below.    The patient was advised to call back or seek an in-person evaluation if the symptoms worsen or if the condition fails to improve as anticipated.  Time:  I spent 10 minutes with the patient via telehealth technology discussing the above problems/concerns.    Rennis Harding, PA-C

## 2021-01-06 NOTE — Patient Instructions (Signed)
  [name], thank you for joining Rennis Harding, PA-C for today's virtual visit.  While this provider is not your primary care provider (PCP), if your PCP is located in our provider database this encounter information will be shared with them immediately following your visit.  Consent: (Patient) Michal Strzelecki provided verbal consent for this virtual visit at the beginning of the encounter.  Current Medications:  Current Outpatient Medications:    benzonatate (TESSALON) 100 MG capsule, Take 1 capsule (100 mg total) by mouth 2 (two) times daily as needed for cough., Disp: 20 capsule, Rfl: 0   oseltamivir (TAMIFLU) 75 MG capsule, Take 1 capsule (75 mg total) by mouth 2 (two) times daily for 5 days., Disp: 10 capsule, Rfl: 0   loratadine (CLARITIN) 10 MG tablet, Take 1 tablet (10 mg total) by mouth daily., Disp: 30 tablet, Rfl: 11   olopatadine (PATADAY) 0.1 % ophthalmic solution, Place 1 drop into both eyes 2 (two) times daily., Disp: 5 mL, Rfl: 0   triamcinolone ointment (KENALOG) 0.5 %, Apply 1 application topically 2 (two) times daily., Disp: 30 g, Rfl: 0   Medications ordered in this encounter:  Meds ordered this encounter  Medications   oseltamivir (TAMIFLU) 75 MG capsule    Sig: Take 1 capsule (75 mg total) by mouth 2 (two) times daily for 5 days.    Dispense:  10 capsule    Refill:  0    Order Specific Question:   Supervising Provider    Answer:   MILLER, BRIAN [[id number]]   benzonatate (TESSALON) 100 MG capsule    Sig: Take 1 capsule (100 mg total) by mouth 2 (two) times daily as needed for cough.    Dispense:  20 capsule    Refill:  0    Order Specific Question:   Supervising Provider    Answer:   Hyacinth Meeker, BRIAN [[id number]]     *If you need refills on other medications prior to your next appointment, please contact your pharmacy*  Follow-Up: Call back or seek an in-person evaluation if the symptoms worsen or if the condition fails to improve as anticipated.  Other  Instructions Get plenty of rest and push fluids Tamilfu prescribed Tesslaon perles prescribed.  Use as needed for cough Use OTC medications like ibuprofen or tylenol as needed fever or pain Follow up with PCP in 1-2 days via phone or e-visit for recheck and to ensure symptoms are improving Follow up in person with PCP, urgent care or ER/ED if you have any new or worsening symptoms such as fever, worsening cough, shortness of breath, chest tightness, chest pain, turning blue, changes in mental status, etc...     If you have been instructed to have an in-person evaluation today at a local Urgent Care facility, please use the link below. It will take you to a list of all of our available [facility], including address, phone number and hours of operation. Please do not delay care.  [facility]  If you or a family member do not have a primary care provider, use the link below to schedule a visit and establish care. When you choose a [facility] primary care physician or advanced practice provider, you gain a long-term partner in health. Find a Primary Care Provider  Learn more about [facility]'s in-office and virtual care options: [facility] Get Care Now

## 2021-05-30 ENCOUNTER — Emergency Department (HOSPITAL_COMMUNITY)
Admission: EM | Admit: 2021-05-30 | Discharge: 2021-05-30 | Disposition: A | Discharge status: Home / Self Care | Payer: 59 | Attending: Emergency Medicine | Admitting: Emergency Medicine

## 2021-05-30 ENCOUNTER — Other Ambulatory Visit: Payer: Self-pay

## 2021-05-30 ENCOUNTER — Encounter (HOSPITAL_COMMUNITY): Payer: Self-pay

## 2021-05-30 ENCOUNTER — Emergency Department (HOSPITAL_COMMUNITY): Payer: 59

## 2021-05-30 DIAGNOSIS — S42254A Nondisplaced fracture of greater tuberosity of right humerus, initial encounter for closed fracture: Secondary | ICD-10-CM | POA: Diagnosis not present

## 2021-05-30 DIAGNOSIS — W109XXA Fall (on) (from) unspecified stairs and steps, initial encounter: Secondary | ICD-10-CM | POA: Diagnosis not present

## 2021-05-30 DIAGNOSIS — Y93K1 Activity, walking an animal: Secondary | ICD-10-CM | POA: Insufficient documentation

## 2021-05-30 DIAGNOSIS — S4991XA Unspecified injury of right shoulder and upper arm, initial encounter: Secondary | ICD-10-CM | POA: Diagnosis present

## 2021-05-30 MED ORDER — NAPROXEN 500 MG PO TABS: 500.0000 mg | ORAL_TABLET | Freq: Two times a day (BID) | ORAL | 0 refills | Status: DC

## 2021-05-30 NOTE — ED Triage Notes (Signed)
Patient arrived with complaints of right shoulder pain following a fall this AM. Per patient she fell over her dog onto a concrete pad. Hx of right wrist surgery. Denies LOC.  ?

## 2021-05-30 NOTE — Discharge Instructions (Signed)
Your x-rays show a slight chip fracture off the edge of your arm but this is nothing that needed surgery, you will need to use the sling that we have given you and the anti-inflammatory such as high-dose naproxen twice a day.  I have sent a prescription to your pharmacy.  Please return to the emergency department for severe worsening swelling numbness or weakness but if this will have discomfort and possibly some bruising over the week. ?

## 2021-05-30 NOTE — ED Provider Notes (Signed)
?[facility] DEPART[facility] ?Provider Note ? ? ?CSN: [id number] ?Arrival date & time: 05/30/21  0744 ? ?  ? ?History ? ?Chief Complaint  ?Patient presents with  ? Arm Injury  ? ? ?Holly Petersen is a 61 y.o. female. ? ? ?Arm Injury ? ?This patient is a 61 year old female who reports that while she was walking her dog this morning she got tangled up and fell down 3 or 4 stairs landing on her right arm and her face, she does not have any signs of injury to her face but has pain with moving her right arm.  No loss of consciousness, no neck pain.  No medications prior to arrival. ? ?Home Medications ?Prior to Admission medications   ?Medication Sig Start Date End Date Taking? Authorizing Provider  ?naproxen (NAPROSYN) 500 MG tablet Take 1 tablet (500 mg total) by mouth 2 (two) times daily with a meal. 05/30/21  Yes Eber Hong, MD  ?   ? ?Allergies    ?Levaquin [levofloxacin], Other, and Ciprofloxacin   ? ?Review of Systems   ?Review of Systems ? ?Positive for arm pain, negative for loss of consciousness, negative for numbness or weakness ? ?Physical Exam ?Updated Vital Signs ?BP (!) 155/88 (BP Location: Left Arm)   Pulse 81   Temp 97.6 ?F (36.4 ?C) (Oral)   Resp 16   Ht 1.651 m (5\' 5" )   Wt 90.3 kg   SpO2 100%   BMI 33.13 kg/m?  ?Physical Exam ?Vitals and nursing note reviewed.  ?Constitutional:   ?   Appearance: She is well-developed. She is not diaphoretic.  ?HENT:  ?   Head: Normocephalic and atraumatic.  ?Eyes:  ?   General:     ?   Right eye: No discharge.     ?   Left eye: No discharge.  ?   Conjunctiva/sclera: Conjunctivae normal.  ?Pulmonary:  ?   Effort: Pulmonary effort is normal. No respiratory distress.  ?Musculoskeletal:     ?   General: Tenderness present. No swelling or deformity.  ?   Right lower leg: No edema.  ?   Left lower leg: No edema.  ?   Comments: The patient has evidence of chronic injury to the right upper extremity, has some scarring from prior surgery, decreased range of motion  of right shoulder and right arm secondary to pain in the right mid to upper arm however is able to fully pronate and supinate at the elbow, normal pulses at the right wrist, normal sensation of the right hand, no obvious visual deformity of the clavicle or shoulder on the right  ?Skin: ?   General: Skin is warm and dry.  ?   Findings: No erythema or rash.  ?Neurological:  ?   Mental Status: She is alert.  ?   Coordination: Coordination normal.  ? ? ?ED Results / Procedures / Treatments   ?Labs ?(all labs ordered are listed, but only abnormal results are displayed) ?Labs Reviewed - No data to display ? ?EKG ?None ? ?Radiology ?DG Shoulder Right ? ?Result Date: 05/30/2021 ?CLINICAL DATA:  Right shoulder pain after fall. EXAM: RIGHT SHOULDER - 2+ VIEW COMPARISON:  None. FINDINGS: Minimally displaced fracture is seen involving greater tuberosity of proximal right humerus. Mild inferior subluxation of humeral head relative to glenoid fossa is noted. No other bony abnormality is noted. Joint spaces are intact. IMPRESSION: Minimally displaced fracture involving greater tuberosity of proximal right humerus. Mild inferior subluxation of humeral head relative to glenoid  fossa is noted. Electronically Signed   By: Lupita Raider M.D.   On: 05/30/2021 08:29  ? ?DG Humerus Right ? ?Result Date: 05/30/2021 ?CLINICAL DATA:  Right arm pain after fall. EXAM: RIGHT HUMERUS - 2+ VIEW COMPARISON:  None. FINDINGS: Minimally displaced fracture is seen involving the greater tuberosity of the proximal right humerus. No dislocation or other abnormality is noted. IMPRESSION: Minimally displaced fracture involving greater tuberosity of proximal right humerus. Electronically Signed   By: Lupita Raider M.D.   On: 05/30/2021 08:27   ? ?Procedures ?Procedures  ? ? ?Medications Ordered in ED ?Medications - No data to display ? ?ED Course/ Medical Decision Making/ A&P ?  ?                        ?Medical Decision Making ?Amount and/or Complexity  of Data Reviewed ?Radiology: ordered. ? ?Risk ?Prescription drug management. ? ? ?The patient has no tenderness over the head over the face or the teeth, no injuries that are visible that would require imaging other than the right shoulder, will proceed with x-ray of the right humerus and shoulder to rule out fractures or dislocations, sling has been ordered for comfort, she declines pain medicines at this time. ? ?Imaging: I personally viewed and interpreted the x-rays of the right shoulder and the right humerus which show a minimally displaced right greater tuberosity fracture, this is a closed injury.  I agree with the radiologist interpretation. ? ?Medications: Medication management involved reviewing patient's home medications and prescribing Naprosyn, the patient is agreeable to the plan ? ?Immobilization: The patient was placed in a sling by nursing, I evaluated the patient after the sling was placed and found there to be good neurovascular function. ? ?ED course: Patient stable, informed of her results and treatment plan, she is agreeable to the plan to follow-up with orthopedics. ? ? ? ? ? ? ? ?Final Clinical Impression(s) / ED Diagnoses ?Final diagnoses:  ?Closed nondisplaced fracture of greater tuberosity of right humerus, initial encounter  ? ? ?Rx / DC Orders ?ED Discharge Orders   ? ?      Ordered  ?  naproxen (NAPROSYN) 500 MG tablet  2 times daily with meals       ? 05/30/21 0853  ? ?  ?  ? ?  ? ? ?  ?Eber Hong, MD ?05/30/21 [phone] ? ?

## 2021-06-06 ENCOUNTER — Ambulatory Visit: Payer: 59 | Admitting: Orthopedic Surgery

## 2021-06-06 DIAGNOSIS — S42254A Nondisplaced fracture of greater tuberosity of right humerus, initial encounter for closed fracture: Secondary | ICD-10-CM

## 2021-06-07 ENCOUNTER — Encounter: Payer: Self-pay | Admitting: Orthopedic Surgery

## 2021-06-07 NOTE — Progress Notes (Signed)
New Patient Visit ? ?Assessment: ?Holly Petersen is a 61 y.o. female with the following: ?1. Closed nondisplaced fracture of greater tuberosity of right humerus, initial encounter ? ?Plan: ?Holly Petersen has a minimally displaced fracture of the right greater tuberosity.  Injury happened a week ago.  Pain is improving.  She is able to get dressed and maintain hygiene.  She is to remain in her sling for the next 2 weeks.  After the next visit, we may be able to progress to gentle ROM below the shoulder.  ? ?Follow-up: ?Return in about 2 weeks (around 06/20/2021). ? ?Subjective: ? ?Chief Complaint  ?Patient presents with  ? Shoulder Injury  ?  RT shoulder ?DOI 05/30/21  fell while walking the dog  ?Has had wrist sx on same arm due to previous fall. Pt has a plate in that wrist  ? ? ?History of Present Illness: ?Holly Petersen is a 61 y.o. female who presents for evaluation of right shoulder pain.  She was walking the dog when she fell and landed on her right arm.  No pain elsewhere.  She presented to the ED and XR demonstrated a minimally displaced fracture of the right greater tuberosity.  Pain is controlled with ibuprofen.  She has remained in a sling.  No numbness or tingling.  ? ? ?Review of Systems: ?No fevers or chills ?No numbness or tingling ?No chest pain ?No shortness of breath ?No bowel or bladder dysfunction ?No GI distress ?No headaches ? ? ?Medical History: ? ?Past Medical History:  ?Diagnosis Date  ? Kidney stones   ? ? ?Past Surgical History:  ?Procedure Laterality Date  ? ABLATION    ? GALLBLADDER SURGERY    ? right wrist surgery Right   ? ? ?Family History  ?Problem Relation Age of Onset  ? COPD Mother   ? Hypertension Mother   ? Thyroid disease Mother   ? Heart disease Father   ? Thyroid disease Brother   ? Diabetes Daughter   ? Thyroid disease Daughter   ? Arthritis Maternal Grandmother   ? Diabetes Paternal Grandmother   ? ?Social History  ? ?Tobacco Use  ? Smoking status: Never  ? Smokeless  tobacco: Never  ?Vaping Use  ? Vaping Use: Never used  ?Substance Use Topics  ? Alcohol use: No  ? Drug use: No  ? ? ?Allergies  ?Allergen Reactions  ? Levaquin [Levofloxacin] Shortness Of Breath  ? Other Shortness Of Breath  ?  Flouroquinolone ?  ? Ciprofloxacin   ? ? ?Current Meds  ?Medication Sig  ? naproxen (NAPROSYN) 500 MG tablet Take 1 tablet (500 mg total) by mouth 2 (two) times daily with a meal.  ? ? ?Objective: ?There were no vitals taken for this visit. ? ?Physical Exam: ? ?General: Alert and oriented. and No acute distress. ?Gait: Normal gait. ? ?Right arm in a sling.  Sensation intact.  2+ radial pulse.  Minimal swelling.  No bruising.  Active motion intact distally.  ? ?IMAGING: ?I personally reviewed images previously obtained from the ED ? ?Minimally displaced fracture of the right greater tuberosity.  Only visible on the scapular Y.  ? ?New Medications:  ?No orders of the defined types were placed in this encounter. ? ? ? ? ?Oliver Barre, MD ? ?06/07/2021 ?10:27 PM ? ? ?

## 2021-06-20 ENCOUNTER — Ambulatory Visit (INDEPENDENT_AMBULATORY_CARE_PROVIDER_SITE_OTHER): Payer: 59 | Admitting: Orthopedic Surgery

## 2021-06-20 ENCOUNTER — Encounter: Payer: Self-pay | Admitting: Orthopedic Surgery

## 2021-06-20 ENCOUNTER — Ambulatory Visit (INDEPENDENT_AMBULATORY_CARE_PROVIDER_SITE_OTHER): Payer: 59

## 2021-06-20 DIAGNOSIS — S42254D Nondisplaced fracture of greater tuberosity of right humerus, subsequent encounter for fracture with routine healing: Secondary | ICD-10-CM

## 2021-06-20 NOTE — Patient Instructions (Signed)
Pendulum   Stand near a wall or a surface that you can hold onto for balance. Bend at the waist and let your left / right arm hang straight down. Use your other arm to support you. Keep your back straight and do not lock your knees. Relax your left / right arm and shoulder muscles, and move your hips and your trunk so your left / right arm swings freely. Your arm should swing because of the motion of your body, not because you are using your arm or shoulder muscles. Keep moving your hips and trunk so your arm swings in the following directions, as told by your health care provider: Side to side. Forward and backward. In clockwise and counterclockwise circles. Continue each motion for 20 seconds, or for as long as told by your health care provider. Slowly return to the starting position.  Repeat 10 times. Complete this exercise daily.  

## 2021-06-20 NOTE — Progress Notes (Signed)
Return Patient Visit ? ?Assessment: ?Holly Petersen is a 61 y.o. female with the following: ?1. Closed nondisplaced fracture of greater tuberosity of right humerus ? ?Plan: ?Holly Petersen sustained a minimally displaced fracture of the right greater tuberosity.  She is gradually getting better.  She feels more comfortable.  Repeat radiographs today demonstrates stable alignment.  The x-ray is essentially nondisplaced at this point.  Urged her to progress slowly.  Recommended pendulum activities, and some passive stretching for the next 3 weeks.  Sling at all times when she is upright.  Okay to remove the sling when she is at home.  Follow-up in 3 weeks. ? ? ?Follow-up: ?Return in about 3 weeks (around 07/11/2021). ? ?Subjective: ? ?Chief Complaint  ?Patient presents with  ? Follow-up  ?  Recheck on right shoulder, DOI 05-30-21  ? ? ?History of Present Illness: ?Holly Petersen is a 61 y.o. female who returns for evaluation of right shoulder pain.  She fell and sustained an injury to her right proximal humerus approximately 3 weeks ago.  She has remained in a sling.  She states that she takes her arm out of the sling when she is in a controlled environment, or sitting for work.  She has been doing some light housework, including washing dishes.  She notes a tightness in the anterior aspect of her shoulder.  She is not taking medications on a regular basis.  She is back to sleeping in a regular bed.  No numbness or tingling. ? ? ?Review of Systems: ?No fevers or chills ?No numbness or tingling ?No chest pain ?No shortness of breath ?No bowel or bladder dysfunction ?No GI distress ?No headaches ? ? ?Objective: ?There were no vitals taken for this visit. ? ?Physical Exam: ? ?General: Alert and oriented. and No acute distress. ?Gait: Normal gait. ? ?Right arm without bruising or swelling.  She tolerates gentle range of motion below the level of the shoulder.  Fingers are warm and well-perfused.  2+ radial pulse.  Sensation  is intact throughout the right upper extremity. ? ?IMAGING: ?I personally reviewed images previously obtained from the ED ? ?X-ray of the right shoulder was obtained in clinic today.  These were compared to prior x-rays.  There has been no interval displacement of the greater tuberosity fracture fragment.  Glenohumeral joint remains reduced.  No acute injuries are noted. ? ?Impression: Healing nondisplaced right greater tuberosity fracture ? ?New Medications:  ?No orders of the defined types were placed in this encounter. ? ? ? ? ?Mordecai Rasmussen, MD ? ?06/20/2021 ?8:58 AM ? ? ?

## 2021-07-11 ENCOUNTER — Ambulatory Visit (INDEPENDENT_AMBULATORY_CARE_PROVIDER_SITE_OTHER): Payer: 59 | Admitting: Orthopedic Surgery

## 2021-07-11 ENCOUNTER — Ambulatory Visit (INDEPENDENT_AMBULATORY_CARE_PROVIDER_SITE_OTHER): Payer: 59

## 2021-07-11 ENCOUNTER — Encounter: Payer: Self-pay | Admitting: Orthopedic Surgery

## 2021-07-11 DIAGNOSIS — S42254D Nondisplaced fracture of greater tuberosity of right humerus, subsequent encounter for fracture with routine healing: Secondary | ICD-10-CM

## 2021-07-11 NOTE — Patient Instructions (Signed)

## 2021-07-11 NOTE — Progress Notes (Signed)
Return Patient Visit  Assessment: Holly Petersen is a 61 y.o. female with the following: 1. Closed nondisplaced fracture of greater tuberosity of right humerus  Plan: Holly Petersen sustained a minimally displaced fracture of the right greater tuberosity.  The fracture is barely visible on radiographs.  Her pain is improved.  Her range of motion has improved.  Okay to remove the sling.  Initiate range of motion, including light strengthening.  Exercises were provided for her.  She is comfortable working on her own.  If she has issues over the next 4 to 6 weeks, we will likely recommend formal physical therapy.  She states her understanding.  Follow-up as needed.  Follow-up: Return if symptoms worsen or fail to improve.  Subjective:  Chief Complaint  Patient presents with   Shoulder Injury    RT/ closed nondisplaced fracture of greater tuberosity of right humerus DOI 05/30/21    History of Present Illness: Holly Petersen is a 61 y.o. female who returns for evaluation of right shoulder pain.  She fell and sustained an injury to her right proximal humerus approximately 6 weeks ago.  She has remained in the sling, but has recently started working on range of motion.  She has done some activities around the house.  Occasional pain in her shoulder.  No numbness or tingling.  Review of Systems: No fevers or chills No numbness or tingling No chest pain No shortness of breath No bowel or bladder dysfunction No GI distress No headaches   Objective: There were no vitals taken for this visit.  Physical Exam:  General: Alert and oriented. and No acute distress. Gait: Normal gait.  No bruising or swelling about the right shoulder.  Forward elevation to 120 degrees, with some discomfort.  Internal rotation of the lumbar spine.  External rotation or side to 40 degrees.  Strength testing deferred.  Fingers are warm and well-perfused.  2+ radial pulse.  IMAGING: I personally reviewed images  previously obtained from the ED  X-ray of the right shoulder was obtained in clinic today.  This was compared to prior x-rays.  Glenohumeral joint remains reduced.  Greater tuberosity fracture fragment remains in good position.  It is not displaced.  X-ray is essentially normal today.  Impression: Healing right greater tuberosity fracture, in stable alignment  New Medications:  No orders of the defined types were placed in this encounter.     Oliver Barre, MD  07/11/2021 10:35 AM

## 2021-07-22 ENCOUNTER — Emergency Department (HOSPITAL_COMMUNITY): Payer: 59

## 2021-07-22 ENCOUNTER — Other Ambulatory Visit: Payer: Self-pay

## 2021-07-22 ENCOUNTER — Emergency Department (HOSPITAL_COMMUNITY)
Admission: EM | Admit: 2021-07-22 | Discharge: 2021-07-22 | Disposition: A | Discharge status: Home / Self Care | Payer: 59 | Attending: Emergency Medicine | Admitting: Emergency Medicine

## 2021-07-22 ENCOUNTER — Encounter (HOSPITAL_COMMUNITY): Payer: Self-pay | Admitting: *Deleted

## 2021-07-22 DIAGNOSIS — S52502A Unspecified fracture of the lower end of left radius, initial encounter for closed fracture: Secondary | ICD-10-CM | POA: Insufficient documentation

## 2021-07-22 DIAGNOSIS — W541XXA Struck by dog, initial encounter: Secondary | ICD-10-CM | POA: Diagnosis not present

## 2021-07-22 DIAGNOSIS — W19XXXA Unspecified fall, initial encounter: Secondary | ICD-10-CM

## 2021-07-22 DIAGNOSIS — M25532 Pain in left wrist: Secondary | ICD-10-CM | POA: Diagnosis present

## 2021-07-22 NOTE — ED Provider Notes (Signed)
[facility] EMERGENCY DEPARTMENT Provider Note   CSN: [id number] Arrival date & time: 07/22/21  7510     History  Chief Complaint  Patient presents with   Marletta Lor    Holly Petersen is a 61 y.o. female.  Patient reports that she tripped over her dog leash and fell patient caught herself with her left hand and wrist.  Patient complains of pain in her wrist  The history is provided by the patient. No language interpreter was used.  Fall This is a new problem. The problem occurs constantly. The problem has not changed since onset.Nothing aggravates the symptoms. Nothing relieves the symptoms. She has tried nothing for the symptoms. The treatment provided no relief.       Home Medications Prior to Admission medications   Not on File      Allergies    Levaquin [levofloxacin], Other, and Ciprofloxacin    Review of Systems   Review of Systems  All other systems reviewed and are negative.   Physical Exam Updated Vital Signs BP (!) 152/91   Pulse 71   Temp (!) 97.3 F (36.3 C) (Oral)   Resp 18   Ht 5\' 4"  (1.626 m)   Wt 90.3 kg   SpO2 100%   BMI 34.16 kg/m  Physical Exam Vitals and nursing note reviewed.  Constitutional:      Appearance: She is well-developed.  HENT:     Head: Normocephalic.     Nose: Nose normal.     Mouth/Throat:     Mouth: Mucous membranes are moist.  Cardiovascular:     Rate and Rhythm: Normal rate.  Pulmonary:     Effort: Pulmonary effort is normal.  Abdominal:     General: There is no distension.  Musculoskeletal:        General: Swelling and tenderness present.     Comments: Swollen tender left wrist, pain with range of motion ,neurovascular neurosensory are intact.  Shoulder nontender full range of motion elbow nontender full range of motion  Neurological:     Mental Status: She is alert and oriented to person, place, and time.  Psychiatric:        Mood and Affect: Mood normal.    ED Results / Procedures / Treatments   Labs (all  labs ordered are listed, but only abnormal results are displayed) Labs Reviewed - No data to display  EKG None  Radiology DG Forearm Left  Result Date: 07/22/2021 CLINICAL DATA:  Fall landing on the left arm. EXAM: LEFT FOREARM - 2 VIEW COMPARISON:  None Available. FINDINGS: Nondisplaced fracture of the distal radius without visible intra-articular extension. Wrist and elbow joint location. IMPRESSION: Nondisplaced distal radius fracture. Electronically Signed   By: 09/21/2021 M.D.   On: 07/22/2021 09:50    Procedures Procedures    Medications Ordered in ED Medications - No data to display  ED Course/ Medical Decision Making/ A&P                           Medical Decision Making Patient fell and injured her left wrist  Amount and/or Complexity of Data Reviewed Radiology: ordered and independent interpretation performed. Decision-making details documented in ED Course.    Details: X-ray shows distal radius fracture  Risk OTC drugs. Risk Details: Patient advised to follow-up with Dr. 09/21/2021 she is placed in a sugar-tong splint patient declined medication for pain she states she has Tylenol and ibuprofen she can take at home  X-ray shows's nondisplaced distal radius fracture patient placed in a splint she is advised to follow-up with her orthopedist Dr. Dallas Schimke for recheck next week patient advised to ice and elevate        Final Clinical Impression(s) / ED Diagnoses Final diagnoses:  Fall, initial encounter  Closed fracture of distal end of left radius, unspecified fracture morphology, initial encounter    Rx / DC Orders ED Discharge Orders     None      An After Visit Summary was printed and given to the patient.    Elson Areas, PA-C 07/22/21 1604    Horton, Clabe Seal, DO 07/23/21 [phone]

## 2021-07-22 NOTE — Discharge Instructions (Addendum)
Return if any problems. Call your Orthopaedist tomorrow to schedule appointment

## 2021-07-22 NOTE — ED Triage Notes (Signed)
Pt states she tripped over her dog while taking them out this am; pt fell landing on left arm; pt has some swelling and limited ROM to left wrist

## 2021-07-25 ENCOUNTER — Ambulatory Visit (INDEPENDENT_AMBULATORY_CARE_PROVIDER_SITE_OTHER): Payer: 59 | Admitting: Orthopedic Surgery

## 2021-07-25 ENCOUNTER — Encounter: Payer: Self-pay | Admitting: Orthopedic Surgery

## 2021-07-25 VITALS — Ht 64.0 in | Wt 199.0 lb

## 2021-07-25 DIAGNOSIS — W01198A Fall on same level from slipping, tripping and stumbling with subsequent striking against other object, initial encounter: Secondary | ICD-10-CM | POA: Diagnosis not present

## 2021-07-25 DIAGNOSIS — S52552A Other extraarticular fracture of lower end of left radius, initial encounter for closed fracture: Secondary | ICD-10-CM | POA: Diagnosis not present

## 2021-07-25 NOTE — Patient Instructions (Addendum)
General Cast Instructions  1.  You were placed in a cast in clinic today.  Please keep the cast material clean, dry and intact.  Please do not use anything to itch the under the cast.  If it gets itchy, you can consider taking benadryl, or similar medication.  If the cast material gets wet, place it on a towel and use a hair dryer on a low setting. 2.  Tylenol or Ibuprofen/Naproxen as needed.   3.  Recommend elevating your extremity as much as possible to help with swelling. 4.  F/u 3 weeks, cast off and repeat XR   Elevate the hand to help with swelling  Can take Aleve/naproxen - over the counter medication is ~200 mg, can take 2 pills, twice daily.  Take with food.

## 2021-07-25 NOTE — Progress Notes (Signed)
Orthopaedic Clinic Return  Assessment: Holly Petersen is a 61 y.o. female with the following: Left distal radius fracture, minimally displaced  Plan: Holly Petersen fell and landed on her left wrist.  She has a minimally displaced fracture of the distal radius.  There is no intra-articular involvement.  Plan to treat in a cast.  Return to clinic in 3 weeks, earlier if there are issues.  Cast application - left short arm cast   Verbal consent was obtained and the correct extremity was identified. A well padded, appropriately molded short arm cast was applied to the left arm Fingers remained warm and well perfused.   There were no sharp edges Patient tolerated the procedure well Cast care instructions were provided    Follow-up: Return in about 3 weeks (around 08/15/2021).   Subjective:  Chief Complaint  Patient presents with   Fracture    Lt wrist fx DOI 07/22/21    History of Present Illness: Holly Petersen is a 61 y.o. female who returns to clinic for evaluation of left arm pain.  Her dog was outside a few days ago when the leash tripped her, and she landed on an outstretched left arm.  She presented to the emergency department.  Radiographs demonstrated minimally displaced.  She was placed in a volar slab splint.  She continues to elevate the hand, which helps the swelling.  She is also taking naproxen as needed.  She denies numbness and tingling  Review of Systems: No fevers or chills No numbness or tingling No chest pain No shortness of breath No bowel or bladder dysfunction No GI distress No headaches   Objective: Ht 5\' 4"  (1.626 m)   Wt 199 lb (90.3 kg)   BMI 34.16 kg/m   Physical Exam:  Alert and oriented.  No acute distress.  Evaluation left wrist demonstrates some residual swelling.  She is palpation of the distal radius.  No obvious deformity.  Sensation is intact throughout the left hand.  Active motion intact in the AIN/PIN/U nerve distribution.  2+ radial  pulse.  IMAGING: I personally ordered and reviewed the following images:  X-ray from the ED demonstrates minimally displaced fracture of the distal radius.  No intra-articular component.  There does appear to be some impaction, just proximal to the joint line.  , MD 07/25/2021 10:17 AM

## 2021-08-15 ENCOUNTER — Encounter: Payer: 59 | Admitting: Orthopedic Surgery

## 2021-08-15 ENCOUNTER — Ambulatory Visit (INDEPENDENT_AMBULATORY_CARE_PROVIDER_SITE_OTHER): Payer: 59 | Admitting: Orthopedic Surgery

## 2021-08-15 ENCOUNTER — Ambulatory Visit (INDEPENDENT_AMBULATORY_CARE_PROVIDER_SITE_OTHER): Payer: 59

## 2021-08-15 ENCOUNTER — Encounter: Payer: Self-pay | Admitting: Orthopedic Surgery

## 2021-08-15 VITALS — Ht 64.0 in | Wt 199.0 lb

## 2021-08-15 DIAGNOSIS — S52552D Other extraarticular fracture of lower end of left radius, subsequent encounter for closed fracture with routine healing: Secondary | ICD-10-CM | POA: Diagnosis not present

## 2021-08-15 NOTE — Patient Instructions (Incomplete)
Our records indicate that you are due for your annual mammogram/breast imaging. While there is no way to prevent breast cancer, early detection provides the best opportunity for curing it. For women over the age of 40, the American Cancer Society recommends a yearly clinical breast exam and a yearly mammogram. These practices have saved thousands of lives. We need your help to ensure that you are receiving optimal medical care. Please call the imaging location that has done you previous mammograms. Please remember to list us as your primary care. This helps make sure we receive a report and can update your chart.  Below is the contact information for several local breast imaging centers. You may call the location that works best for you, and they will be happy to assistance in making you an appointment. You do not need an order for a regular screening mammogram. However, if you are having any problems or concerns with you breast area, please let your primary care provider know, and appropriate orders will be placed. Please let our office know if you have any questions or concerns. Or if you need information for another imaging center not on this list or outside of the area. We are commented to working with you on your health care journey.   The mobile unit/bus ([facility]) - they come twice a month to our location.  These appointments can be made through our office or by call [facility]  [facility]  [address] [place], [place] [address] Phone [phone]  [facility] Radiology Department  [address]  [place], [place] [address] [phone]  [facility])  [address] [place], [place] [address] [phone]  [facility] [place]  [address] [place] [address] [phone]  [facility]  [address], [address] [place] [address] [phone]  [facility] [place]  [address] [place], [place] [address] [phone]  [facility] [address] [place], [place] [address] [phone]  [facility] [address] [place],  [address] [phone]  [facility] [address] [place] [address] [phone]   Hypertension, Adult High blood pressure (hypertension) is when the force of blood pumping through the arteries is too strong. The arteries are the blood vessels that carry blood from the heart throughout the body. Hypertension forces the heart to work harder to pump blood and may cause arteries to become narrow or stiff. Untreated or uncontrolled hypertension can lead to a heart attack, heart failure, a stroke, kidney disease, and other problems. A blood pressure reading consists of a higher number over a lower number. Ideally, your blood pressure should be below 120/80. The first ("top") number is called the systolic pressure. It is a measure of the pressure in your arteries as your heart beats. The second ("bottom") number is called the diastolic pressure. It is a measure of the pressure in your arteries as the heart relaxes. What are the causes? The exact cause of this condition is not known. There are some conditions that result in high blood pressure. What increases the risk? Certain factors may make you more likely to develop high blood pressure. Some of these risk   factors are under your control, including: Smoking. Not getting enough exercise or physical activity. Being overweight. Having too much fat, sugar, calories, or salt (sodium) in your diet. Drinking too much alcohol. Other risk factors include: Having a personal history of heart disease, diabetes, high cholesterol, or kidney  disease. Stress. Having a family history of high blood pressure and high cholesterol. Having obstructive sleep apnea. Age. The risk increases with age. What are the signs or symptoms? High blood pressure may not cause symptoms. Very high blood pressure (hypertensive crisis) may cause: Headache. Fast or irregular heartbeats (palpitations). Shortness of breath. Nosebleed. Nausea and vomiting. Vision changes. Severe chest pain, dizziness, and seizures. How is this diagnosed? This condition is diagnosed by measuring your blood pressure while you are seated, with your arm resting on a flat surface, your legs uncrossed, and your feet flat on the floor. The cuff of the blood pressure monitor will be placed directly against the skin of your upper arm at the level of your heart. Blood pressure should be measured at least twice using the same arm. Certain conditions can cause a difference in blood pressure between your right and left arms. If you have a high blood pressure reading during one visit or you have normal blood pressure with other risk factors, you may be asked to: Return on a different day to have your blood pressure checked again. Monitor your blood pressure at home for 1 week or longer. If you are diagnosed with hypertension, you may have other blood or imaging tests to help your health care provider understand your overall risk for other conditions. How is this treated? This condition is treated by making healthy lifestyle changes, such as eating healthy foods, exercising more, and reducing your alcohol intake. You may be referred for counseling on a healthy diet and physical activity. Your health care provider may prescribe medicine if lifestyle changes are not enough to get your blood pressure under control and if: Your systolic blood pressure is above 130. Your diastolic blood pressure is above 80. Your personal target blood pressure may vary depending on your medical conditions,  your age, and other factors. Follow these instructions at home: Eating and drinking  Eat a diet that is high in fiber and potassium, and low in sodium, added sugar, and fat. An example of this eating plan is called the DASH diet. DASH stands for Dietary Approaches to Stop Hypertension. To eat this way: Eat plenty of fresh fruits and vegetables. Try to fill one half of your plate at each meal with fruits and vegetables. Eat whole grains, such as whole-wheat pasta, brown rice, or whole-grain bread. Fill about one fourth of your plate with whole grains. Eat or drink low-fat dairy products, such as skim milk or low-fat yogurt. Avoid fatty cuts of meat, processed or cured meats, and poultry with skin. Fill about one fourth of your plate with lean proteins, such as fish, chicken without skin, beans, eggs, or tofu. Avoid pre-made and processed foods. These tend to be higher in sodium, added sugar, and fat. Reduce your daily sodium intake. Many people with hypertension should eat less than 1,500 mg of sodium a day. Do not drink alcohol if: Your health care provider tells you not to drink. You are pregnant, may be pregnant, or are planning to become pregnant. If you drink alcohol: Limit how much you have to: 0-1 drink a day for women. 0-2 drinks a day for men. Know how much alcohol is in your drink. In   the [place] one drink equals one 12 oz bottle of beer (355 mL), one 5 oz glass of wine (148 mL), or one 1 oz glass of hard liquor (44 mL). Lifestyle  Work with your health care provider to maintain a healthy body weight or to lose weight. Ask what an ideal weight is for you. Get at least 30 minutes of exercise that causes your heart to beat faster (aerobic exercise) most days of the week. Activities may include walking, swimming, or biking. Include exercise to strengthen your muscles (resistance exercise), such as Pilates or lifting weights, as part of your weekly exercise routine. Try to do these types  of exercises for 30 minutes at least 3 days a week. Do not use any products that contain nicotine or tobacco. These products include cigarettes, chewing tobacco, and vaping devices, such as e-cigarettes. If you need help quitting, ask your health care provider. Monitor your blood pressure at home as told by your health care provider. Keep all follow-up visits. This is important. Medicines Take over-the-counter and prescription medicines only as told by your health care provider. Follow directions carefully. Blood pressure medicines must be taken as prescribed. Do not skip doses of blood pressure medicine. Doing this puts you at risk for problems and can make the medicine less effective. Ask your health care provider about side effects or reactions to medicines that you should watch for. Contact a health care provider if you: Think you are having a reaction to a medicine you are taking. Have headaches that keep coming back (recurring). Feel dizzy. Have swelling in your ankles. Have trouble with your vision. Get help right away if you: Develop a severe headache or confusion. Have unusual weakness or numbness. Feel faint. Have severe pain in your chest or abdomen. Vomit repeatedly. Have trouble breathing. These symptoms may be an emergency. Get help right away. Call 911. Do not wait to see if the symptoms will go away. Do not drive yourself to the hospital. Summary Hypertension is when the force of blood pumping through your arteries is too strong. If this condition is not controlled, it may put you at risk for serious complications. Your personal target blood pressure may vary depending on your medical conditions, your age, and other factors. For most people, a normal blood pressure is less than 120/80. Hypertension is treated with lifestyle changes, medicines, or a combination of both. Lifestyle changes include losing weight, eating a healthy, low-sodium diet, exercising more, and limiting  alcohol. This information is not intended to replace advice given to you by your health care provider. Make sure you discuss any questions you have with your health care provider. Document Revised: 12/05/2020 Document Reviewed: 12/05/2020 Elsevier Patient Education  2023 Elsevier Inc.  

## 2021-08-15 NOTE — Patient Instructions (Signed)
Brace at all times except for hygiene

## 2021-08-16 ENCOUNTER — Encounter: Payer: Self-pay | Admitting: Orthopedic Surgery

## 2021-08-16 ENCOUNTER — Ambulatory Visit (INDEPENDENT_AMBULATORY_CARE_PROVIDER_SITE_OTHER): Payer: Managed Care, Other (non HMO)

## 2021-08-16 ENCOUNTER — Ambulatory Visit (INDEPENDENT_AMBULATORY_CARE_PROVIDER_SITE_OTHER): Payer: Managed Care, Other (non HMO) | Admitting: Family

## 2021-08-16 ENCOUNTER — Other Ambulatory Visit (HOSPITAL_COMMUNITY)
Admission: RE | Admit: 2021-08-16 | Discharge: 2021-08-16 | Disposition: A | Discharge status: Home / Self Care | Payer: 59 | Source: Ambulatory Visit | Attending: Family | Admitting: Family

## 2021-08-16 ENCOUNTER — Encounter: Payer: Self-pay | Admitting: Family

## 2021-08-16 VITALS — BP 125/75 | HR 76 | Temp 97.8°F | Ht 64.0 in | Wt 200.0 lb

## 2021-08-16 DIAGNOSIS — Z1231 Encounter for screening mammogram for malignant neoplasm of breast: Secondary | ICD-10-CM | POA: Diagnosis not present

## 2021-08-16 DIAGNOSIS — Z780 Asymptomatic menopausal state: Secondary | ICD-10-CM

## 2021-08-16 DIAGNOSIS — Z01419 Encounter for gynecological examination (general) (routine) without abnormal findings: Secondary | ICD-10-CM

## 2021-08-16 DIAGNOSIS — E669 Obesity, unspecified: Secondary | ICD-10-CM | POA: Diagnosis not present

## 2021-08-16 DIAGNOSIS — Z0000 Encounter for general adult medical examination without abnormal findings: Secondary | ICD-10-CM | POA: Insufficient documentation

## 2021-08-16 DIAGNOSIS — R030 Elevated blood-pressure reading, without diagnosis of hypertension: Secondary | ICD-10-CM

## 2021-08-16 DIAGNOSIS — Z1211 Encounter for screening for malignant neoplasm of colon: Secondary | ICD-10-CM | POA: Diagnosis not present

## 2021-08-16 DIAGNOSIS — Z0001 Encounter for general adult medical examination with abnormal findings: Secondary | ICD-10-CM

## 2021-08-16 NOTE — Progress Notes (Signed)
Orthopaedic Clinic Return  Assessment: Holly Petersen is a 61 y.o. female with the following: Left distal radius fracture, minimally displaced  Plan: [name] has a minimally displaced left distal radius fracture.  It is difficult to appreciate this injury on x-rays today.  Overall alignment is good.  She has some discomfort, once the cast is removed.  We discussed proceeding with a more cast versus wearing a brace at all times for the next 3 weeks.  She elected to proceed with the brace.  She was fitted for this.  I will see her back in approximately 3 weeks.  At that time, we can discuss initiating range of motion.  Okay to work on light activity, including typing.  No lifting anything greater than a coffee cup.   Follow-up: Return in about 3 weeks (around 09/05/2021).   Subjective:  Chief Complaint  Patient presents with   Fracture    Lt wrist fx DOI 07/22/21    History of Present Illness: Holly Petersen is a 61 y.o. female who returns to clinic for evaluation of left arm pain.  Approximate 3 weeks ago, she tripped over her dog and fell.  She landed on an outstretched left arm.  She had a minimally displaced left distal radius fracture.  She was placed in a cast at last visit.  Since then, she has done well.  She tolerated the cast.  Pain is controlled.  No numbness or tingling.   Review of Systems: No fevers or chills No numbness or tingling No chest pain No shortness of breath No bowel or bladder dysfunction No GI distress No headaches   Objective: Ht 5\' 4"  (1.626 m)   Wt 199 lb (90.3 kg)   BMI 34.16 kg/m   Physical Exam:  Alert and oriented.  No acute distress.  After removal of the cast, there is no skin breakdown.  No bruising or swelling is appreciated about the left wrist.  She does have some tenderness to palpation over the distal radius.  She has some pain with gentle range of motion of the left wrist.  Fingers are warm and well-perfused.  Sensation is  intact throughout the left hand.  2+ radial pulse.   IMAGING: I personally ordered and reviewed the following images:  Multiple views of the left wrist were obtained in clinic today.  These were compared to prior x-rays.  Minimally displaced fracture is not easily visualized.  There is no change in overall alignment.  No interval displacement.  No interval callus formation.  No acute injuries are noted.  Impression: Healing minimally displaced, left distal radius fracture.   , MD 08/16/2021 3:33 PM

## 2021-08-16 NOTE — Progress Notes (Signed)
Subjective:    Patient ID: Holly Petersen, female    DOB: [birth year], 61 y.o.   MRN: [id number]  Chief Complaint  Patient presents with   Medical Management of Chronic Issues    CPE with pap   PT presents to the office today with CPE with pap. She broke her right shoulder in 05/2021, then last month she fractured her right humerus. She had her cast removed yesterday. Followed by Ortho.  Hypertension This is a new problem. The current episode started more than 1 year ago. The problem is unchanged. The problem is uncontrolled. Associated symptoms include malaise/fatigue. Pertinent negatives include no peripheral edema or shortness of breath. Risk factors for coronary artery disease include obesity. Past treatments include nothing. The current treatment provides no improvement. There is no history of CVA or heart failure.      Review of Systems  Constitutional:  Positive for malaise/fatigue.  Respiratory:  Negative for shortness of breath.   All other systems reviewed and are negative.  Family History  Problem Relation Age of Onset   COPD Mother    Hypertension Mother    Thyroid disease Mother    Heart disease Father    Thyroid disease Brother    Diabetes Daughter    Thyroid disease Daughter    Arthritis Maternal Grandmother    Diabetes Paternal Grandmother    Social History   Socioeconomic History   Marital status: Married    Spouse name: Not on file   Number of children: Not on file   Years of education: Not on file   Highest education level: Not on file  Occupational History   Not on file  Tobacco Use   Smoking status: Never   Smokeless tobacco: Never  Vaping Use   Vaping Use: Never used  Substance and Sexual Activity   Alcohol use: No   Drug use: No   Sexual activity: Not on file  Other Topics Concern   Not on file  Social History Narrative   Not on file   Social Determinants of Health   Financial Resource Strain: Not on file  Food Insecurity: Not on  file  Transportation Needs: Not on file  Physical Activity: Not on file  Stress: Not on file  Social Connections: Not on file       Objective:   Physical Exam Vitals reviewed.  Constitutional:      General: She is not in acute distress.    Appearance: She is well-developed. She is obese.  HENT:     Head: Normocephalic and atraumatic.     Right Ear: Tympanic membrane normal.     Left Ear: Tympanic membrane normal.  Eyes:     Pupils: Pupils are equal, round, and reactive to light.  Neck:     Thyroid: No thyromegaly.  Cardiovascular:     Rate and Rhythm: Normal rate and regular rhythm.     Heart sounds: Normal heart sounds. No murmur heard. Pulmonary:     Effort: Pulmonary effort is normal. No respiratory distress.     Breath sounds: Normal breath sounds. No wheezing.  Abdominal:     General: Bowel sounds are normal. There is no distension.     Palpations: Abdomen is soft.     Tenderness: There is no abdominal tenderness.  Genitourinary:    Comments: Bimanual exam- no adnexal masses or tenderness, ovaries nonpalpable   Cervix parous and pink- No discharge  Musculoskeletal:        General: No tenderness. Normal  range of motion.     Cervical back: Normal range of motion and neck supple.  Skin:    General: Skin is warm and dry.  Neurological:     Mental Status: She is alert and oriented to person, place, and time.     Cranial Nerves: No cranial nerve deficit.     Deep Tendon Reflexes: Reflexes are normal and symmetric.  Psychiatric:        Behavior: Behavior normal.        Thought Content: Thought content normal.        Judgment: Judgment normal.      BP (!) 159/91   Pulse 76   Temp 97.8 F (36.6 C)   Ht 5' 4"  (1.626 m)   Wt 200 lb (90.7 kg)   SpO2 100%   BMI 34.33 kg/m      Assessment & Plan:  [name] comes in today with chief complaint of Medical Management of Chronic Issues (CPE with pap)   Diagnosis and orders addressed:  1. Colon cancer  screening - CBC with Differential/Platelet - CMP14+EGFR - Cologuard  2. Encounter for screening mammogram for malignant neoplasm of breast - CBC with Differential/Platelet - CMP14+EGFR - MM 3D SCREEN BREAST BILATERAL; Future  3. Annual physical exam - CBC with Differential/Platelet - CMP14+EGFR - Lipid panel - Cologuard - MM 3D SCREEN BREAST BILATERAL; Future - TSH - DG [facility] DEXA - Cytology - PAP([place])  4. Obesity (BMI 30.0-34.9) - CBC with Differential/Platelet - CMP14+EGFR  5. Post-menopausal - CBC with Differential/Platelet - CMP14+EGFR - DG [facility] DEXA  6. Elevated BP without diagnosis of hypertension Pt monitor BP at home, call if BP is constantly >140/90 - CBC with Differential/Platelet - CMP14+EGFR  7. Gynecologic exam normal - Cytology - PAP([place])   Labs pending Health Maintenance reviewed Diet and exercise encouraged  Follow up plan: 6 months    [name], FNP

## 2021-08-17 LAB — CMP14+EGFR
ALT: 16 IU/L (ref 0–32)
AST: 20 IU/L (ref 0–40)
Albumin/Globulin Ratio: 1.4 (ref 1.2–2.2)
Albumin: 4.1 g/dL (ref 3.8–4.9)
Alkaline Phosphatase: 135 IU/L — ABNORMAL HIGH (ref 44–121)
BUN/Creatinine Ratio: 21 (ref 9–23)
BUN: 13 mg/dL (ref 6–24)
Bilirubin Total: 0.4 mg/dL (ref 0.0–1.2)
CO2: 23 mmol/L (ref 20–29)
Calcium: 9.7 mg/dL (ref 8.7–10.2)
Chloride: 105 mmol/L (ref 96–106)
Creatinine, Ser: 0.61 mg/dL (ref 0.57–1.00)
Globulin, Total: 3 g/dL (ref 1.5–4.5)
Glucose: 86 mg/dL (ref 70–99)
Potassium: 4.8 mmol/L (ref 3.5–5.2)
Sodium: 142 mmol/L (ref 134–144)
Total Protein: 7.1 g/dL (ref 6.0–8.5)
eGFR: 104 mL/min/{1.73_m2} (ref >59)

## 2021-08-17 LAB — CBC WITH DIFFERENTIAL/PLATELET
Basophils Absolute: 0.1 10*3/uL (ref 0.0–0.2)
Basos: 1 %
EOS (ABSOLUTE): 0.5 10*3/uL — ABNORMAL HIGH (ref 0.0–0.4)
Eos: 5 %
Hematocrit: 41.1 % (ref 34.0–46.6)
Hemoglobin: 13.3 g/dL (ref 11.1–15.9)
Immature Grans (Abs): 0 10*3/uL (ref 0.0–0.1)
Immature Granulocytes: 0 %
Lymphocytes Absolute: 3.5 10*3/uL — ABNORMAL HIGH (ref 0.7–3.1)
Lymphs: 36 %
MCH: 30 pg (ref 26.6–33.0)
MCHC: 32.4 g/dL (ref 31.5–35.7)
MCV: 93 fL (ref 79–97)
Monocytes Absolute: 0.6 10*3/uL (ref 0.1–0.9)
Monocytes: 6 %
Neutrophils Absolute: 5.1 10*3/uL (ref 1.4–7.0)
Neutrophils: 52 %
Platelets: 375 10*3/uL (ref 150–450)
RBC: 4.44 x10E6/uL (ref 3.77–5.28)
RDW: 12.5 % (ref 11.7–15.4)
WBC: 9.8 10*3/uL (ref 3.4–10.8)

## 2021-08-17 LAB — LIPID PANEL
Chol/HDL Ratio: 4.2 ratio (ref 0.0–4.4)
Cholesterol, Total: 245 mg/dL — ABNORMAL HIGH (ref 100–199)
HDL: 58 mg/dL (ref >39)
LDL Chol Calc (NIH): 179 mg/dL — ABNORMAL HIGH (ref 0–99)
Triglycerides: 52 mg/dL (ref 0–149)
VLDL Cholesterol Cal: 8 mg/dL (ref 5–40)

## 2021-08-17 LAB — TSH: TSH: 1.57 u[IU]/mL (ref 0.450–4.500)

## 2021-08-20 ENCOUNTER — Other Ambulatory Visit: Payer: Self-pay | Admitting: Family

## 2021-08-20 DIAGNOSIS — M8580 Other specified disorders of bone density and structure, unspecified site: Secondary | ICD-10-CM | POA: Insufficient documentation

## 2021-08-20 LAB — CYTOLOGY - PAP
Adequacy: ABSENT
Diagnosis: NEGATIVE

## 2021-09-04 ENCOUNTER — Ambulatory Visit (INDEPENDENT_AMBULATORY_CARE_PROVIDER_SITE_OTHER): Payer: 59 | Admitting: Orthopedic Surgery

## 2021-09-04 ENCOUNTER — Encounter: Payer: Self-pay | Admitting: Orthopedic Surgery

## 2021-09-04 DIAGNOSIS — S52552D Other extraarticular fracture of lower end of left radius, subsequent encounter for closed fracture with routine healing: Secondary | ICD-10-CM

## 2021-09-04 NOTE — Progress Notes (Signed)
Orthopaedic Clinic Return  Assessment: Holly Petersen is a 61 y.o. female with the following: Left distal radius fracture, minimally displaced  Plan: [name] has a minimally displaced left distal radius fracture.  Her wrist has been immobilized for approximately 6 weeks.  At this point, she can transition out of the brace.  I recommend gradual increase in range of motion.  Over the next 2-3 weeks, anticipate that she will be able to initiate strengthening of her left wrist.  Medications as needed.  Follow-up in 4 weeks.  Follow-up: Return in about 4 weeks (around 10/02/2021).   Subjective:  Chief Complaint  Patient presents with   Fracture    Lt wrist DOI 07/22/21    History of Present Illness: Holly Petersen is a 61 y.o. female who returns to clinic for evaluation of left arm pain.  Approximate 6 weeks ago, she tripped over her dog and fell.  At the last visit, she was transition to a wrist brace.  She has kept this in place at all times, with the exception of hygiene.  Her pain has improved.  She notes cramping type pains in her left hand, specifically when she is at work.  The pain gets worse at night.  She is only taking medications on occasion.  No numbness or tingling.   Review of Systems: No fevers or chills No numbness or tingling No chest pain No shortness of breath No bowel or bladder dysfunction No GI distress No headaches   Objective: There were no vitals taken for this visit.  Physical Exam:  Alert and oriented.  No acute distress.  No skin breakdown.  No bruising about the left wrist.  No swelling.  Mild tenderness to palpation of the distal radius.  Restricted range of motion, including flexion and extension.  Near full pronation and supination.  She is able to make a full fist.  Fingers warm and well-perfused.   IMAGING: I personally ordered and reviewed the following images:  No new imaging obtained today.   Oliver Barre, MD 09/04/2021 10:06  PM

## 2021-09-04 NOTE — Patient Instructions (Signed)
Wrist Fracture Rehab Ask your health care provider which exercises are safe for you. Do exercises exactly as told by your health care provider and adjust them as directed. It is normal to feel mild stretching, pulling, tightness, or discomfort as you do these exercises. Stop right away if you feel sudden pain or your pain gets worse. Do not begin these exercises until told by your health care provider. Stretching and range-of-motion exercises These exercises warm up your muscles and joints and improve the movement and flexibility of your wrist and hand. These exercises also help to relieve pain,numbness, and tingling. Finger flexion and extension Sit or stand with your elbow at your side. Open and stretch your left / right fingers as wide as you can (extension). Hold this position for 10 seconds. Close your left / right fingers into a gentle fist (flexion). Hold this position for 10 seconds. Slowly return to the starting position. Repeat 10 times. Complete this exercise 1-2 times a day. Wrist flexion Bend your left / right elbow to a 90-degree angle (right angle) with your palm facing the floor. Bend your wrist forward so your fingers point toward the floor (flexion). Hold this position for 10 seconds. Slowly return to the starting position. Repeat 10 times. Complete this exercise 1-2 times a day. Wrist extension Bend your left / right elbow to a 90-degree angle (right angle) with your palm facing the floor. Bend your wrist backward so your fingers point toward the ceiling (extension). Hold this position for 10 seconds. Slowly return to the starting position. Repeat 10 times. Complete this exercise 1-2 times a day. Ulnar deviation Bend your left / right elbow to a 90-degree angle (right angle), and rest your forearm on a table with your palm facing down. Keeping your hand flat on the table, bend your left / right wrist toward your small finger (pinkie). This is ulnar deviation. Hold this  position for 10 seconds. Slowly return to the starting position. Repeat 10 times. Complete this exercise 1-2 times a day. Radial deviation Bend your left / right elbow to a 90-degree angle (right angle), and rest your forearm on a table with your palm facing down. Keeping your hand flat on the table, bend your left / right wrist toward your thumb. This is radial deviation. Hold this position for 10 seconds. Slowly return to the starting position. Repeat 10 times. Complete this exercise 1-2 times a day. Forearm rotation, supination Stand or sit with your left / right elbow bent to a 90-degree angle (right angle) at your side. Position your forearm so that the thumb is facing the ceiling (neutral position). Turn (rotate) your palm up toward the ceiling (supination), stopping when you feel a gentle stretch. Hold this position for 10 seconds. Slowly return to the starting position. Repeat 10 times. Complete this exercise 1-2 times a day. Forearm rotation, pronation Stand or sit with your left / right elbow bent to a 90-degree angle (right angle) at your side. Position your forearm so that the thumb is facing the ceiling (neutral position). Turn (rotate) your palm down toward the floor (pronation), stopping when you feel a gentle stretch. Hold this position for 10 seconds. Slowly return to the starting position. Repeat 10 times. Complete this exercise 1-2 times a day. Wrist flexion stretch  Extend your left / right arm in front of you and turn your palm down toward the floor. If told by your health care provider, bend your left / right arm to a 90-degree angle (  right angle) at your side. Using your uninjured hand, gently press over the back of your left / right hand to bend your wrist and fingers toward the floor (flexion). Go as far as you can to feel a stretch without causing pain. Hold this position for 10 seconds. Slowly return to the starting position. Repeat 10 times. Complete this  exercise 1-2 times a day. Wrist extension stretch  Extend your left / right arm in front of you and turn your palm up toward the ceiling. If told by your health care provider, bend your left / right arm to a 90-degree angle (right angle) at your side. Using your uninjured hand, gently press over the palm of your left / right hand to bend your wrist and fingers toward the floor (extension). Go as far as you can to feel a stretch without causing pain. Hold this position for 10 seconds. Slowly return to the starting position. Repeat 10 times. Complete this exercise 1-2 times a day. Forearm rotation stretch, supination Stand or sit with your arms at your sides. Bend your left / right elbow to a 90-degree angle (right angle). Using your uninjured hand, turn your left / right palm up toward the ceiling (assisted supination) until you feel a gentle stretch in the inside of your forearm. Hold this position for 10 seconds. Slowly return to the starting position. Repeat 10 times. Complete this exercise 1-2 times a day. Forearm rotation stretch, pronation Stand or sit with your arms at your sides. Bend your left / right elbow to a 90-degree angle (right angle). Using your uninjured hand, turn your left / right palm down toward the floor (assisted pronation) until you feel a gentle stretch in the top of your forearm. Hold this position for 10 seconds. Slowly return to the starting position. Repeat 10 times. Complete this exercise 1-2 times a day. Strengthening exercises These exercises build strength and endurance in your wrist and hand. Enduranceis the ability to use your muscles for a long time, even after they get tired. Wrist flexion Sit with your left / right forearm supported on a table. Your elbow should be at waist height. Rest your hand over the edge of the table, palm up. Gently grasp a 5 lb / kg weight (can of soup). Or, hold an exercise band or tube in both hands, keeping your hands at  the same level and hip distance apart. There should be slight tension in the exercise band or tube. Without moving your forearm or elbow, slowly bend your wrist up toward the ceiling (wrist flexion). Hold this position for 10 seconds. Slowly return to the starting position. Repeat 10 times. Complete this exercise 1-2 times a day. Wrist extension Sit with your left / right forearm supported on a table. Your elbow should be at waist height. Rest your hand over the edge of the table, palm down. Gently grasp a 5 lb / kg weight. Or, hold an exercise band or tube in both hands, keeping your hands at the same level and hip distance apart. There should be slight tension in the exercise band or tube. Without moving your forearm or elbow, slowly curl your hand up toward the ceiling (extension). Hold this position for 10 seconds. Slowly return to the starting position. Repeat 10 times. Complete this exercise 1-2 times a day. Forearm rotation, supination  Sit with your left / right forearm supported on a table. Your elbow should be at waist height. Rest your hand over the edge of the   table, palm down. Gently grasp a lightweight hammer near the head. As this exercise gets easier for you, try holding the hammer farther down the handle. Without moving your elbow, slowly turn (rotate) your palm up toward the ceiling (supination). Hold this position for 10 seconds. Slowly return to the starting position. Repeat 10 times. Complete this exercise 1-2 times a day. Forearm rotation, pronation  Sit with your left / right forearm supported on a table. Your elbow should be at waist height. Rest your hand over the edge of the table, palm up. Gently grasp a lightweight hammer near the head. As this exercise gets easier for you, try holding the hammer farther down the handle. Without moving your elbow, slowly turn (rotate) your palm down toward the floor (pronation). Hold this position for 10 seconds. Slowly return  to the starting position. Repeat 10 times. Complete this exercise 1-2 times a day. Grip strengthening  Hold one of these items in your left / right hand: a dense sponge, a stress ball, or a large, rolled sock. Slowly squeeze the object as hard as you can without increasing any pain. Hold your squeeze for 10 seconds. Slowly release your grip. Repeat 10 times. Complete this exercise 1-2 times a day. This information is not intended to replace advice given to you by your health care provider. Make sure you discuss any questions you have with your healthcare provider. Document Revised: 06/10/2019 Document Reviewed: 06/10/2019 Elsevier Patient Education  2022 [employer]  

## 2021-09-11 LAB — COLOGUARD: COLOGUARD: NEGATIVE

## 2021-09-17 ENCOUNTER — Ambulatory Visit
Admission: RE | Admit: 2021-09-17 | Discharge: 2021-09-17 | Disposition: A | Discharge status: Home / Self Care | Payer: 59 | Source: Ambulatory Visit | Attending: Family | Admitting: Family

## 2021-09-17 DIAGNOSIS — Z0000 Encounter for general adult medical examination without abnormal findings: Secondary | ICD-10-CM

## 2021-09-17 DIAGNOSIS — Z1231 Encounter for screening mammogram for malignant neoplasm of breast: Secondary | ICD-10-CM

## 2021-10-02 ENCOUNTER — Encounter: Payer: 59 | Admitting: Orthopedic Surgery

## 2022-01-26 ENCOUNTER — Telehealth: Payer: Managed Care, Other (non HMO) | Admitting: Nurse Practitioner

## 2022-01-26 DIAGNOSIS — J069 Acute upper respiratory infection, unspecified: Secondary | ICD-10-CM

## 2022-01-26 MED ORDER — AMOXICILLIN-POT CLAVULANATE 875-125 MG PO TABS: 1.0000 | ORAL_TABLET | Freq: Two times a day (BID) | ORAL | 0 refills | Status: DC

## 2022-01-26 NOTE — Progress Notes (Signed)
Virtual Visit Consent   Holly Petersen, you are scheduled for a virtual visit with Mary-Margaret Daphine Deutscher, FNP, a [facility] provider, today.     Just as with appointments in the office, your consent must be obtained to participate.  Your consent will be active for this visit and any virtual visit you may have with one of our providers in the next 365 days.     If you have a MyChart account, a copy of this consent can be sent to you electronically.  All virtual visits are billed to your insurance company just like a traditional visit in the office.    As this is a virtual visit, video technology does not allow for your provider to perform a traditional examination.  This may limit your provider's ability to fully assess your condition.  If your provider identifies any concerns that need to be evaluated in person or the need to arrange testing (such as labs, EKG, etc.), we will make arrangements to do so.     Although advances in technology are sophisticated, we cannot ensure that it will always work on either your end or our end.  If the connection with a video visit is poor, the visit may have to be switched to a telephone visit.  With either a video or telephone visit, we are not always able to ensure that we have a secure connection.     I need to obtain your verbal consent now.   Are you willing to proceed with your visit today? YES   Asako Saliba has provided verbal consent on 01/26/2022 for a virtual visit (video or telephone).   Mary-Margaret Daphine Deutscher, FNP   Date: 01/26/2022 9:05 AM   Virtual Visit via Video Note   I, Mary-Margaret Daphine Deutscher, connected with Holly Petersen ([id number], Sep 24, 1960) on 01/26/22 at  9:15 AM EST by a video-enabled telemedicine application and verified that I am speaking with the correct person using two identifiers.  Location: Patient: Virtual Visit Location Patient: Home Provider: Virtual Visit Location Provider: Mobile   I discussed the limitations  of evaluation and management by telemedicine and the availability of in person appointments. The patient expressed understanding and agreed to proceed.    History of Present Illness: Holly Petersen is a 61 y.o. who identifies as a female who was assigned female at birth, and is being seen today for uri .  HPI: URI  This is a new problem. The current episode started 1 to 4 weeks ago. The problem has been waxing and waning. There has been no fever. Associated symptoms include congestion, coughing, ear pain, headaches, rhinorrhea, sneezing and a sore throat. She has tried acetaminophen (flonase, cold and sinus meds) for the symptoms. The treatment provided mild relief.    Review of Systems  HENT:  Positive for congestion, ear pain, rhinorrhea, sneezing and sore throat.   Respiratory:  Positive for cough.   Neurological:  Positive for headaches.    Problems:  Patient Active Problem List   Diagnosis Date Noted   Osteopenia 08/20/2021   Elevated BP without diagnosis of hypertension 03/13/2017   Obesity (BMI 30.0-34.9) 07/12/2016    Allergies:  Allergies  Allergen Reactions   Levaquin [Levofloxacin] Shortness Of Breath   Other Shortness Of Breath    Flouroquinolone    Ciprofloxacin    Medications:  Current Outpatient Medications:    Ascorbic Acid (VITAMIN C) 100 MG tablet, Take 100 mg by mouth daily., Disp: , Rfl:    cholecalciferol (VITAMIN D3) 25  MCG (1000 UNIT) tablet, Take 1,000 Units by mouth daily., Disp: , Rfl:    Multiple Vitamins-Minerals (MULTIVITAMIN WITH MINERALS) tablet, Take 1 tablet by mouth daily., Disp: , Rfl:   Observations/Objective: Patient is well-developed, well-nourished in no acute distress.  Resting comfortably  at home.  Head is normocephalic, atraumatic.  No labored breathing.  Speech is clear and coherent with logical content.  Patient is alert and oriented at baseline.  Raspy voice Nose red Wet cough   Assessment and Plan:  Rayshawn Visconti in  today with chief complaint of No chief complaint on file.   1. URI with cough and congestion 1. Take meds as prescribed 2. Use a cool mist humidifier especially during the winter months and when heat has been humid. 3. Use saline nose sprays frequently 4. Saline irrigations of the nose can be very helpful if done frequently.  * 4X daily for 1 week*  * Use of a nettie pot can be helpful with this. Follow directions with this* 5. Drink plenty of fluids 6. Keep thermostat turn down low 7.For any cough or congestion- delsm or mucinex OTC 8. For fever or aces or pains- take tylenol or ibuprofen appropriate for age and weight.  * for fevers greater than 101 orally you may alternate ibuprofen and tylenol every  3 hours.   Meds ordered this encounter  Medications   amoxicillin-clavulanate (AUGMENTIN) 875-125 MG tablet    Sig: Take 1 tablet by mouth 2 (two) times daily.    Dispense:  14 tablet    Refill:  0    Order Specific Question:   Supervising Provider    Answer:   Merrilee Jansky [id number]      Follow Up Instructions: I discussed the assessment and treatment plan with the patient. The patient was provided an opportunity to ask questions and all were answered. The patient agreed with the plan and demonstrated an understanding of the instructions.  A copy of instructions were sent to the patient via MyChart.  The patient was advised to call back or seek an in-person evaluation if the symptoms worsen or if the condition fails to improve as anticipated.  Time:  I spent 6 minutes with the patient via telehealth technology discussing the above problems/concerns.    Mary-Margaret Daphine Deutscher, FNP

## 2022-01-26 NOTE — Patient Instructions (Signed)
  [name], thank you for joining Bennie Pierini, FNP for today's virtual visit.  While this provider is not your primary care provider (PCP), if your PCP is located in our provider database this encounter information will be shared with them immediately following your visit.   A [facility] MyChart account gives you access to today's visit and all your visits, tests, and labs performed at [facility] " click here if you don't have a [facility] MyChart account or go to [email or web address]  Consent: (Patient) Holly Petersen provided verbal consent for this virtual visit at the beginning of the encounter.  Current Medications:  Current Outpatient Medications:    amoxicillin-clavulanate (AUGMENTIN) 875-125 MG tablet, Take 1 tablet by mouth 2 (two) times daily., Disp: 14 tablet, Rfl: 0   Ascorbic Acid (VITAMIN C) 100 MG tablet, Take 100 mg by mouth daily., Disp: , Rfl:    cholecalciferol (VITAMIN D3) 25 MCG (1000 UNIT) tablet, Take 1,000 Units by mouth daily., Disp: , Rfl:    Multiple Vitamins-Minerals (MULTIVITAMIN WITH MINERALS) tablet, Take 1 tablet by mouth daily., Disp: , Rfl:    Medications ordered in this encounter:  Meds ordered this encounter  Medications   amoxicillin-clavulanate (AUGMENTIN) 875-125 MG tablet    Sig: Take 1 tablet by mouth 2 (two) times daily.    Dispense:  14 tablet    Refill:  0    Order Specific Question:   Supervising Provider    Answer:   Merrilee Jansky [id number]     *If you need refills on other medications prior to your next appointment, please contact your pharmacy*  Follow-Up: Call back or seek an in-person evaluation if the symptoms worsen or if the condition fails to improve as anticipated.  [facility] [phone]  Other Instructions 1. Take meds as prescribed 2. Use a cool mist humidifier especially during the winter months and when heat has been humid. 3. Use saline nose sprays frequently 4.  Saline irrigations of the nose can be very helpful if done frequently.  * 4X daily for 1 week*  * Use of a nettie pot can be helpful with this. Follow directions with this* 5. Drink plenty of fluids 6. Keep thermostat turn down low 7.For any cough or congestion- delsym or mucinex 8. For fever or aces or pains- take tylenol or ibuprofen appropriate for age and weight.  * for fevers greater than 101 orally you may alternate ibuprofen and tylenol every  3 hours.      If you have been instructed to have an in-person evaluation today at a local Urgent Care facility, please use the link below. It will take you to a list of all of our available [facility], including address, phone number and hours of operation. Please do not delay care.  [facility]  If you or a family member do not have a primary care provider, use the link below to schedule a visit and establish care. When you choose a [facility] primary care physician or advanced practice provider, you gain a long-term partner in health. Find a Primary Care Provider  Learn more about [facility]'s in-office and virtual care options: [facility] - Get Care Now

## 2022-04-11 ENCOUNTER — Encounter: Payer: Self-pay | Admitting: Radiology

## 2022-04-14 ENCOUNTER — Emergency Department (HOSPITAL_COMMUNITY)
Admission: EM | Admit: 2022-04-14 | Discharge: 2022-04-14 | Disposition: A | Discharge status: Home / Self Care | Payer: Managed Care, Other (non HMO) | Attending: Student | Admitting: Student

## 2022-04-14 ENCOUNTER — Other Ambulatory Visit: Payer: Self-pay

## 2022-04-14 ENCOUNTER — Encounter (HOSPITAL_COMMUNITY): Payer: Self-pay

## 2022-04-14 ENCOUNTER — Emergency Department (HOSPITAL_COMMUNITY): Payer: Managed Care, Other (non HMO)

## 2022-04-14 DIAGNOSIS — N3000 Acute cystitis without hematuria: Secondary | ICD-10-CM | POA: Diagnosis not present

## 2022-04-14 DIAGNOSIS — D649 Anemia, unspecified: Secondary | ICD-10-CM | POA: Diagnosis not present

## 2022-04-14 DIAGNOSIS — J069 Acute upper respiratory infection, unspecified: Secondary | ICD-10-CM | POA: Insufficient documentation

## 2022-04-14 DIAGNOSIS — R000 Tachycardia, unspecified: Secondary | ICD-10-CM | POA: Insufficient documentation

## 2022-04-14 DIAGNOSIS — D72829 Elevated white blood cell count, unspecified: Secondary | ICD-10-CM | POA: Diagnosis not present

## 2022-04-14 DIAGNOSIS — Z20822 Contact with and (suspected) exposure to covid-19: Secondary | ICD-10-CM | POA: Diagnosis not present

## 2022-04-14 DIAGNOSIS — B9689 Other specified bacterial agents as the cause of diseases classified elsewhere: Secondary | ICD-10-CM | POA: Insufficient documentation

## 2022-04-14 DIAGNOSIS — R509 Fever, unspecified: Secondary | ICD-10-CM | POA: Diagnosis present

## 2022-04-14 DIAGNOSIS — N3080 Other cystitis without hematuria: Secondary | ICD-10-CM

## 2022-04-14 LAB — CBC WITH DIFFERENTIAL/PLATELET
Abs Immature Granulocytes: 0.05 10*3/uL (ref 0.00–0.07)
Basophils Absolute: 0.1 10*3/uL (ref 0.0–0.1)
Basophils Relative: 1 %
Eosinophils Absolute: 0.2 10*3/uL (ref 0.0–0.5)
Eosinophils Relative: 1 %
HCT: 34.1 % — ABNORMAL LOW (ref 36.0–46.0)
Hemoglobin: 11.6 g/dL — ABNORMAL LOW (ref 12.0–15.0)
Immature Granulocytes: 0 %
Lymphocytes Relative: 21 %
Lymphs Abs: 3.2 10*3/uL (ref 0.7–4.0)
MCH: 30.6 pg (ref 26.0–34.0)
MCHC: 34 g/dL (ref 30.0–36.0)
MCV: 90 fL (ref 80.0–100.0)
Monocytes Absolute: 1.7 10*3/uL — ABNORMAL HIGH (ref 0.1–1.0)
Monocytes Relative: 11 %
Neutro Abs: 10.5 10*3/uL — ABNORMAL HIGH (ref 1.7–7.7)
Neutrophils Relative %: 66 %
Platelets: 352 10*3/uL (ref 150–400)
RBC: 3.79 MIL/uL — ABNORMAL LOW (ref 3.87–5.11)
RDW: 12.4 % (ref 11.5–15.5)
WBC: 15.7 10*3/uL — ABNORMAL HIGH (ref 4.0–10.5)
nRBC: 0 % (ref 0.0–0.2)

## 2022-04-14 LAB — BASIC METABOLIC PANEL
Anion gap: 9 (ref 5–15)
BUN: 12 mg/dL (ref 6–20)
CO2: 24 mmol/L (ref 22–32)
Calcium: 8.3 mg/dL — ABNORMAL LOW (ref 8.9–10.3)
Chloride: 98 mmol/L (ref 98–111)
Creatinine, Ser: 0.88 mg/dL (ref 0.44–1.00)
GFR, Estimated: >60 mL/min (ref >60)
Glucose, Bld: 101 mg/dL — ABNORMAL HIGH (ref 70–99)
Potassium: 3.2 mmol/L — ABNORMAL LOW (ref 3.5–5.1)
Sodium: 131 mmol/L — ABNORMAL LOW (ref 135–145)

## 2022-04-14 LAB — URINALYSIS, ROUTINE W REFLEX MICROSCOPIC
Bilirubin Urine: NEGATIVE
Glucose, UA: NEGATIVE mg/dL
Ketones, ur: 20 mg/dL — AB
Nitrite: POSITIVE — AB
Protein, ur: NEGATIVE mg/dL
Specific Gravity, Urine: 1.006 (ref 1.005–1.030)
WBC, UA: >50 WBC/hpf (ref 0–5)
pH: 6 (ref 5.0–8.0)

## 2022-04-14 LAB — RESP PANEL BY RT-PCR (RSV, FLU A&B, COVID)  RVPGX2
Influenza A by PCR: NEGATIVE
Influenza B by PCR: NEGATIVE
Resp Syncytial Virus by PCR: NEGATIVE
SARS Coronavirus 2 by RT PCR: NEGATIVE

## 2022-04-14 MED ORDER — CEFADROXIL 500 MG PO CAPS: 500.0000 mg | ORAL_CAPSULE | Freq: Two times a day (BID) | ORAL | 0 refills | Status: AC

## 2022-04-14 MED ORDER — ACETAMINOPHEN 325 MG PO TABS
650.0000 mg | ORAL_TABLET | Freq: Once | ORAL | Status: AC
  Administered 2022-04-14: 650 mg via ORAL
  Filled 2022-04-14: qty 2

## 2022-04-14 NOTE — Discharge Instructions (Addendum)
You were diagnosed today with a urinary tract infection.  Your symptoms are also consistent with an upper respiratory tract infection.  Some of your symptoms including the chills and fevers may be coming from a urinary tract infection.  I have prescribed an antibiotic to help cover for this.  You will take this twice daily for the next 7 days.  Please continue to drink plenty of fluids and use acetaminophen or ibuprofen as needed for fever and pain control.  Follow-up as needed your primary care provider.  If you develop any life-threatening symptoms please return to the emergency department.

## 2022-04-14 NOTE — ED Triage Notes (Signed)
Pt has had fever, body aches, cough, chills and diarrhea since Tuesday.

## 2022-04-14 NOTE — ED Provider Notes (Signed)
[facility] Provider Note   CSN: [id number] Arrival date & time: 04/14/22  1903     History  Chief Complaint  Patient presents with   flu like symptoms    Holly Petersen is a 62 y.o. female.  Patient presents to the emergency department complaining of fever, body aches, cough, and chills which began on Tuesday.  She states her symptoms have fluctuated in severity since that time.  She states she was feeling better yesterday but began to feel worse again today.  The patient has past medical history significant for kidney stones  HPI     Home Medications Prior to Admission medications   Medication Sig Start Date End Date Taking? Authorizing Provider  cefadroxil (DURICEF) 500 MG capsule Take 1 capsule (500 mg total) by mouth 2 (two) times daily for 7 days. 04/14/22 04/21/22 Yes Dorothyann Peng, PA-C  amoxicillin-clavulanate (AUGMENTIN) 875-125 MG tablet Take 1 tablet by mouth 2 (two) times daily. 01/26/22   Hassell Done, Mary-Margaret, FNP  Ascorbic Acid (VITAMIN C) 100 MG tablet Take 100 mg by mouth daily.    [provider]  cholecalciferol (VITAMIN D3) 25 MCG (1000 UNIT) tablet Take 1,000 Units by mouth daily.    [provider]  Multiple Vitamins-Minerals (MULTIVITAMIN WITH MINERALS) tablet Take 1 tablet by mouth daily.    [provider]      Allergies    Levaquin [levofloxacin], Other, and Ciprofloxacin    Review of Systems   Review of Systems  Constitutional:  Positive for chills and fever.  Respiratory:  Positive for cough.   Gastrointestinal:  Positive for diarrhea.  Musculoskeletal:  Positive for myalgias.    Physical Exam Updated Vital Signs BP (!) 147/86   Pulse (!) 117   Temp (!) 100.4 F (38 C) (Oral)   Resp 18   Ht '5\' 4"'$  (1.626 m)   Wt 89.8 kg   SpO2 99%   BMI 33.99 kg/m  Physical Exam Vitals and nursing note reviewed.  Constitutional:      General: She is not in acute distress.     Appearance: She is well-developed.  HENT:     Head: Normocephalic and atraumatic.  Eyes:     Conjunctiva/sclera: Conjunctivae normal.  Cardiovascular:     Rate and Rhythm: Regular rhythm. Tachycardia present.     Heart sounds: No murmur heard. Pulmonary:     Effort: Pulmonary effort is normal. No respiratory distress.     Breath sounds: Normal breath sounds.  Abdominal:     Palpations: Abdomen is soft.     Tenderness: There is no abdominal tenderness.  Musculoskeletal:        General: No swelling.     Cervical back: Neck supple.  Skin:    General: Skin is warm and dry.     Capillary Refill: Capillary refill takes less than 2 seconds.  Neurological:     Mental Status: She is alert.  Psychiatric:        Mood and Affect: Mood normal.     ED Results / Procedures / Treatments   Labs (all labs ordered are listed, but only abnormal results are displayed) Labs Reviewed  BASIC METABOLIC PANEL - Abnormal; Notable for the following components:      Result Value   Sodium 131 (*)    Potassium 3.2 (*)    Glucose, Bld 101 (*)    Calcium 8.3 (*)    All other components within normal limits  CBC  WITH DIFFERENTIAL/PLATELET - Abnormal; Notable for the following components:   WBC 15.7 (*)    RBC 3.79 (*)    Hemoglobin 11.6 (*)    HCT 34.1 (*)    Neutro Abs 10.5 (*)    Monocytes Absolute 1.7 (*)    All other components within normal limits  URINALYSIS, ROUTINE W REFLEX MICROSCOPIC - Abnormal; Notable for the following components:   APPearance HAZY (*)    Hgb urine dipstick MODERATE (*)    Ketones, ur 20 (*)    Nitrite POSITIVE (*)    Leukocytes,Ua LARGE (*)    Bacteria, UA MANY (*)    Non Squamous Epithelial 0-5 (*)    All other components within normal limits  RESP PANEL BY RT-PCR (RSV, FLU A&B, COVID)  [id number]    EKG None  Radiology DG Chest 2 View  Result Date: 04/14/2022 CLINICAL DATA:  Chest tightness/cough EXAM: CHEST - 2 VIEW COMPARISON:  None Available. FINDINGS: The  cardiomediastinal silhouette is normal in contour. No pleural effusion. No pneumothorax. No acute pleuroparenchymal abnormality. Visualized abdomen is unremarkable. Mild multilevel degenerative changes of the thoracic spine. IMPRESSION: No acute cardiopulmonary abnormality. Electronically Signed   By: Valentino Saxon M.D.   On: 04/14/2022 19:58    Procedures Procedures    Medications Ordered in ED Medications  acetaminophen (TYLENOL) tablet 650 mg (650 mg Oral Given 04/14/22 1937)    ED Course/ Medical Decision Making/ A&P                             Medical Decision Making Amount and/or Complexity of Data Reviewed Labs: ordered. Radiology: ordered.  Risk OTC drugs.   This patient presents to the ED for concern of upper respiratory symptoms including fever, chills, cough, body aches, this involves an extensive number of treatment options, and is a complaint that carries with it a high risk of complications and morbidity.  The differential diagnosis includes COVID-19, influenza, RSV, pneumonia, other viral processes, and others   Co morbidities that complicate the patient evaluation  History of high blood pressure without hypertension diagnosis   Additional history obtained:  Additional history obtained from patient's husband External records from outside source obtained and reviewed including: no recent relevant medical visits on file for review   Lab Tests:  I Ordered, and personally interpreted labs.  The pertinent results include: WBC 15.7, hemoglobin 11.6, UA with positive nitrite, moderate hemoglobin, ketones, large leukocytes, many bacteria   Imaging Studies ordered:  I ordered imaging studies including chest x-ray I independently visualized and interpreted imaging which showed no acute disease I agree with the radiologist interpretation   Problem List / ED Course / Critical interventions / Medication management   I ordered medication including Tylenol for  fever Reevaluation of the patient after these medicines showed that the patient improved I have reviewed the patients home medicines and have made adjustments as needed   Test / Admission - Considered:  Patient does have symptoms consistent with an upper respiratory infection.  Due to the severity of patient's symptoms proceeded with routine lab work.  This revealed a UTI with leukocytosis.  Plan to discharge patient home with antibiotics to treat for the UTI.  I do feel she likely had a URI at the same time.  Patient will continue with supportive care at home and complete antibiotics.  Recommend follow-up with primary care as needed.  Return precautions provided.  Final Clinical Impression(s) / ED Diagnoses Final diagnoses:  Upper respiratory tract infection, unspecified type  Acute bacterial simple cystitis    Rx / DC Orders ED Discharge Orders          Ordered    cefadroxil (DURICEF) 500 MG capsule  2 times daily        04/14/22 2213              Ronny Bacon 04/14/22 2213    Teressa Lower, MD 04/15/22 1205

## 2022-04-14 NOTE — ED Notes (Signed)
Cool compress applied to forehead

## 2022-04-15 ENCOUNTER — Telehealth: Payer: Self-pay

## 2022-04-15 NOTE — Transitions of Care (Post Inpatient/ED Visit) (Unsigned)
   04/15/2022  Name: Holly Petersen MRN: [id number] DOB: [birth year]  Today's TOC FU Call Status: Today's TOC FU Call Status:: Unsuccessul Call (1st Attempt) Unsuccessful Call (1st Attempt) Date: 04/15/22  Attempted to reach the patient regarding the most recent Inpatient/ED visit.  Follow Up Plan: Additional outreach attempts will be made to reach the patient to complete the Transitions of Care (Post Inpatient/ED visit) call.   Signature Juanda Crumble, [place] Direct Dial [phone]

## 2022-04-16 NOTE — Transitions of Care (Post Inpatient/ED Visit) (Signed)
   04/16/2022  Name: Holly Petersen MRN: [id number] DOB: 02/06/1961  Today's TOC FU Call Status: Today's TOC FU Call Status:: Unsuccessful Call (2nd Attempt) Unsuccessful Call (1st Attempt) Date: 04/15/22 Unsuccessful Call (2nd Attempt) Date: 04/16/22  Attempted to reach the patient regarding the most recent Inpatient/ED visit.  Follow Up Plan: No further outreach attempts will be made at this time. We have been unable to contact the patient.  Signature Juanda Crumble, [place] Direct Dial [phone]

## 2022-04-26 ENCOUNTER — Encounter: Payer: Self-pay | Admitting: Nurse Practitioner

## 2022-04-26 ENCOUNTER — Ambulatory Visit (INDEPENDENT_AMBULATORY_CARE_PROVIDER_SITE_OTHER): Payer: Managed Care, Other (non HMO) | Admitting: Nurse Practitioner

## 2022-04-26 VITALS — BP 146/94 | HR 87 | Temp 98.9°F | Resp 20 | Ht 64.0 in | Wt 191.0 lb

## 2022-04-26 DIAGNOSIS — N3000 Acute cystitis without hematuria: Secondary | ICD-10-CM

## 2022-04-26 DIAGNOSIS — Z09 Encounter for follow-up examination after completed treatment for conditions other than malignant neoplasm: Secondary | ICD-10-CM

## 2022-04-26 DIAGNOSIS — Z87440 Personal history of urinary (tract) infections: Secondary | ICD-10-CM | POA: Diagnosis not present

## 2022-04-26 LAB — MICROSCOPIC EXAMINATION
RBC, Urine: NONE SEEN /hpf (ref 0–2)
Renal Epithel, UA: NONE SEEN /hpf

## 2022-04-26 LAB — URINALYSIS, COMPLETE
Bilirubin, UA: NEGATIVE
Glucose, UA: NEGATIVE
Nitrite, UA: POSITIVE — AB
Protein,UA: NEGATIVE
Specific Gravity, UA: 1.015 (ref 1.005–1.030)
Urobilinogen, Ur: 0.2 mg/dL (ref 0.2–1.0)
pH, UA: 5 (ref 5.0–7.5)

## 2022-04-26 NOTE — Progress Notes (Signed)
   Subjective:    Patient ID: Holly Petersen, female    DOB: Jun 23, 1960, 62 y.o.   MRN: [id number]   Chief Complaint: ED follow up  HPI Patient went to ed on 04/14/22 with flu like symptoms. Sh ewas dx with viral URI. Subsquently found to have UTI and was treated with antibiotics. She is here today for follow up. She denies any urinary symptoms now. Patient Active Problem List   Diagnosis Date Noted   Osteopenia 08/20/2021   Elevated BP without diagnosis of hypertension 03/13/2017   Obesity (BMI 30.0-34.9) 07/12/2016       Review of Systems  Constitutional:  Negative for chills and fever.  Respiratory: Negative.    Cardiovascular: Negative.   Genitourinary:  Negative for dysuria, frequency and urgency.  Psychiatric/Behavioral: Negative.    All other systems reviewed and are negative.      Objective:   Physical Exam Vitals reviewed.  Constitutional:      Appearance: Normal appearance.  HENT:     Left Ear: Ear canal normal.  Cardiovascular:     Rate and Rhythm: Normal rate and regular rhythm.     Heart sounds: Normal heart sounds.  Pulmonary:     Effort: Pulmonary effort is normal.     Breath sounds: Normal breath sounds.  Skin:    General: Skin is warm.  Neurological:     General: No focal deficit present.     Mental Status: She is oriented to person, place, and time.  Psychiatric:        Mood and Affect: Mood normal.        Behavior: Behavior normal.    BP (!) 146/94   Pulse 87   Temp 98.9 F (37.2 C) (Temporal)   Resp 20   Ht 5\' 4"  (1.626 m)   Wt 191 lb (86.6 kg)   SpO2 98%   BMI 32.79 kg/m   Urine clear       Assessment & Plan:  Holly Petersen in today with chief complaint of Hospitalization Follow-up   1. Recent urinary tract infection - Urinalysis, Complete  2. Acute cystitis without hematuria Resovled UTI Continue to force fluids  3. Hospital discharge follow-up Hospital records reviewed    The above assessment and management plan  was discussed with the patient. The patient verbalized understanding of and has agreed to the management plan. Patient is aware to call the clinic if symptoms persist or worsen. Patient is aware when to return to the clinic for a follow-up visit. Patient educated on when it is appropriate to go to the emergency department.   Mary-Margaret Hassell Done, FNP

## 2022-05-02 NOTE — Telephone Encounter (Signed)
Ntbs.   

## 2022-06-14 ENCOUNTER — Telehealth: Payer: Managed Care, Other (non HMO) | Admitting: Nurse Practitioner

## 2022-06-14 DIAGNOSIS — J0110 Acute frontal sinusitis, unspecified: Secondary | ICD-10-CM | POA: Diagnosis not present

## 2022-06-14 MED ORDER — AMOXICILLIN-POT CLAVULANATE 875-125 MG PO TABS: 1.0000 | ORAL_TABLET | Freq: Two times a day (BID) | ORAL | 0 refills | Status: AC

## 2022-06-14 MED ORDER — FLUTICASONE PROPIONATE 50 MCG/ACT NA SUSP: 2.0000 | Freq: Every day | NASAL | 6 refills | Status: DC

## 2022-06-14 NOTE — Progress Notes (Signed)
Virtual Visit Consent   Holly Petersen, you are scheduled for a virtual visit with a  provider today. Just as with appointments in the office, your consent must be obtained to participate. Your consent will be active for this visit and any virtual visit you may have with one of our providers in the next 365 days. If you have a MyChart account, a copy of this consent can be sent to you electronically.  As this is a virtual visit, video technology does not allow for your provider to perform a traditional examination. This may limit your provider's ability to fully assess your condition. If your provider identifies any concerns that need to be evaluated in person or the need to arrange testing (such as labs, EKG, etc.), we will make arrangements to do so. Although advances in technology are sophisticated, we cannot ensure that it will always work on either your end or our end. If the connection with a video visit is poor, the visit may have to be switched to a telephone visit. With either a video or telephone visit, we are not always able to ensure that we have a secure connection.  By engaging in this virtual visit, you consent to the provision of healthcare and authorize for your insurance to be billed (if applicable) for the services provided during this visit. Depending on your insurance coverage, you may receive a charge related to this service.  I need to obtain your verbal consent now. Are you willing to proceed with your visit today? Holly Petersen has provided verbal consent on 06/14/2022 for a virtual visit (video or telephone). Viviano Simas, FNP  Date: 06/14/2022 10:33 AM  Virtual Visit via Video Note   I, Viviano Simas, connected with  Holly Petersen  ([id number], 12/26/1960) on 06/14/22 at 10:45 AM EDT by a video-enabled telemedicine application and verified that I am speaking with the correct person using two identifiers.  Location: Patient: Virtual Visit Location Patient:  Home Provider: Virtual Visit Location Provider: Home Office   I discussed the limitations of evaluation and management by telemedicine and the availability of in person appointments. The patient expressed understanding and agreed to proceed.    History of Present Illness: Holly Petersen is a 62 y.o. who identifies as a female who was assigned female at birth, and is being seen today for sinus congestion.  She has been suffering from congestion for the past 2-3 weeks, started her allergy regimen 3 weeks ago   She has been using: Claritin, tylenol, pataday and flonase without relief   Feels it has been getting much worse over the past 2 days with worsening headaches, ear pressure and PND with mild cough   Denies a fever    Problems:  Patient Active Problem List   Diagnosis Date Noted   Osteopenia 08/20/2021   Elevated BP without diagnosis of hypertension 03/13/2017   Obesity (BMI 30.0-34.9) 07/12/2016    Allergies:  Allergies  Allergen Reactions   Levaquin [Levofloxacin] Shortness Of Breath   Other Shortness Of Breath    Flouroquinolone    Ciprofloxacin    Medications:  Current Outpatient Medications:    Ascorbic Acid (VITAMIN C) 100 MG tablet, Take 100 mg by mouth daily., Disp: , Rfl:    cholecalciferol (VITAMIN D3) 25 MCG (1000 UNIT) tablet, Take 1,000 Units by mouth daily., Disp: , Rfl:    Multiple Vitamins-Minerals (MULTIVITAMIN WITH MINERALS) tablet, Take 1 tablet by mouth daily., Disp: , Rfl:   Observations/Objective: Patient is well-developed, well-nourished in  no acute distress.  Resting comfortably  at home.  Head is normocephalic, atraumatic.  No labored breathing.  Speech is clear and coherent with logical content.  Patient is alert and oriented at baseline.    Assessment and Plan: 1. Acute non-recurrent frontal sinusitis  - amoxicillin-clavulanate (AUGMENTIN) 875-125 MG tablet; Take 1 tablet by mouth 2 (two) times daily for 7 days. Take with food   Dispense: 14 tablet; Refill: 0 - fluticasone (FLONASE) 50 MCG/ACT nasal spray; Place 2 sprays into both nostrils daily.  Dispense: 16 g; Refill: 6    Continue allergy regimen may consider switching to Zyrtec in the evenings    Follow Up Instructions: I discussed the assessment and treatment plan with the patient. The patient was provided an opportunity to ask questions and all were answered. The patient agreed with the plan and demonstrated an understanding of the instructions.  A copy of instructions were sent to the patient via MyChart unless otherwise noted below.    The patient was advised to call back or seek an in-person evaluation if the symptoms worsen or if the condition fails to improve as anticipated.  Time:  I spent 15 minutes with the patient via telehealth technology discussing the above problems/concerns.    Viviano Simas, FNP

## 2022-06-21 ENCOUNTER — Telehealth: Payer: Managed Care, Other (non HMO) | Admitting: Nurse Practitioner

## 2022-09-25 ENCOUNTER — Ambulatory Visit
Admission: EM | Admit: 2022-09-25 | Discharge: 2022-09-25 | Disposition: A | Discharge status: Home / Self Care | Payer: Managed Care, Other (non HMO) | Attending: Family Medicine | Admitting: Family Medicine

## 2022-09-25 DIAGNOSIS — N390 Urinary tract infection, site not specified: Secondary | ICD-10-CM | POA: Diagnosis present

## 2022-09-25 LAB — POCT URINALYSIS DIP (MANUAL ENTRY)
Bilirubin, UA: NEGATIVE
Glucose, UA: NEGATIVE mg/dL
Ketones, POC UA: NEGATIVE mg/dL
Nitrite, UA: POSITIVE — AB
Protein Ur, POC: NEGATIVE mg/dL
Spec Grav, UA: 1.01 (ref 1.010–1.025)
Urobilinogen, UA: 0.2 E.U./dL
pH, UA: 6.5 (ref 5.0–8.0)

## 2022-09-25 MED ORDER — SULFAMETHOXAZOLE-TRIMETHOPRIM 800-160 MG PO TABS: 1.0000 | ORAL_TABLET | Freq: Two times a day (BID) | ORAL | 0 refills | Status: AC

## 2022-09-25 NOTE — ED Provider Notes (Signed)
[facility]    CSN: [id number] Arrival date & time: 09/25/22  1220      History   Chief Complaint No chief complaint on file.   HPI Holly Petersen is a 62 y.o. female.   Patient presenting today with several day history of dysuria, urinary frequency, chills, fatigue, back aching, nausea.  Denies hematuria, bowel changes, vomiting, vaginal symptoms.  So far trying over-the-counter remedies with minimal relief.    Past Medical History:  Diagnosis Date   Kidney stones     Patient Active Problem List   Diagnosis Date Noted   Osteopenia 08/20/2021   Elevated BP without diagnosis of hypertension 03/13/2017   Obesity (BMI 30.0-34.9) 07/12/2016    Past Surgical History:  Procedure Laterality Date   ABLATION     GALLBLADDER SURGERY     right wrist surgery Right     OB History   No obstetric history on file.      Home Medications    Prior to Admission medications   Medication Sig Start Date End Date Taking? Authorizing Provider  sulfamethoxazole-trimethoprim (BACTRIM DS) 800-160 MG tablet Take 1 tablet by mouth 2 (two) times daily for 3 days. 09/25/22 09/28/22 Yes Particia Nearing, PA-C  Ascorbic Acid (VITAMIN C) 100 MG tablet Take 100 mg by mouth daily.    [provider]  cholecalciferol (VITAMIN D3) 25 MCG (1000 UNIT) tablet Take 1,000 Units by mouth daily.    [provider]  fluticasone (FLONASE) 50 MCG/ACT nasal spray Place 2 sprays into both nostrils daily. 06/14/22   Viviano Simas, FNP  Multiple Vitamins-Minerals (MULTIVITAMIN WITH MINERALS) tablet Take 1 tablet by mouth daily.    [provider]    Family History Family History  Problem Relation Age of Onset   COPD Mother    Hypertension Mother    Thyroid disease Mother    Heart disease Father    Thyroid disease Brother    Diabetes Daughter    Thyroid disease Daughter    Arthritis Maternal Grandmother    Diabetes Paternal Grandmother     Social  History Social History   Tobacco Use   Smoking status: Never   Smokeless tobacco: Never  Vaping Use   Vaping status: Never Used  Substance Use Topics   Alcohol use: No   Drug use: No     Allergies   Ciprofloxacin, Levaquin [levofloxacin], and Other   Review of Systems Review of Systems Per HPI  Physical Exam Triage Vital Signs ED Triage Vitals  Encounter Vitals Group     BP 09/25/22 1251 (!) 152/91     Systolic BP Percentile --      Diastolic BP Percentile --      Pulse Rate 09/25/22 1251 99     Resp 09/25/22 1251 12     Temp 09/25/22 1251 99.4 F (37.4 C)     Temp src --      SpO2 09/25/22 1251 98 %     Weight --      Height --      Head Circumference --      Peak Flow --      Pain Score 09/25/22 1254 5     Pain Loc --      Pain Education --      Exclude from Growth Chart --    No data found.  Updated Vital Signs BP (!) 152/91 (BP Location: Right Arm)   Pulse 99   Temp 99.4 F (37.4 C)  Resp 12   SpO2 98%   Visual Acuity Right Eye Distance:   Left Eye Distance:   Bilateral Distance:    Right Eye Near:   Left Eye Near:    Bilateral Near:     Physical Exam Vitals and nursing note reviewed.  Constitutional:      Appearance: Normal appearance. She is not ill-appearing.  HENT:     Head: Atraumatic.  Eyes:     Extraocular Movements: Extraocular movements intact.     Conjunctiva/sclera: Conjunctivae normal.  Cardiovascular:     Rate and Rhythm: Normal rate and regular rhythm.     Heart sounds: Normal heart sounds.  Pulmonary:     Effort: Pulmonary effort is normal.     Breath sounds: Normal breath sounds.  Abdominal:     General: Bowel sounds are normal. There is no distension.     Palpations: Abdomen is soft.     Tenderness: There is no abdominal tenderness. There is no right CVA tenderness, left CVA tenderness or guarding.  Musculoskeletal:        General: Normal range of motion.     Cervical back: Normal range of motion and neck  supple.  Skin:    General: Skin is warm and dry.  Neurological:     Mental Status: She is alert and oriented to person, place, and time.  Psychiatric:        Mood and Affect: Mood normal.        Thought Content: Thought content normal.        Judgment: Judgment normal.      UC Treatments / Results  Labs (all labs ordered are listed, but only abnormal results are displayed) Labs Reviewed  POCT URINALYSIS DIP (MANUAL ENTRY) - Abnormal; Notable for the following components:      Result Value   Color, UA light yellow (*)    Blood, UA small (*)    Nitrite, UA Positive (*)    Leukocytes, UA Small (1+) (*)    All other components within normal limits  URINE CULTURE    EKG   Radiology No results found.  Procedures Procedures (including critical care time)  Medications Ordered in UC Medications - No data to display  Initial Impression / Assessment and Plan / UC Course  I have reviewed the triage vital signs and the nursing notes.  Pertinent labs & imaging results that were available during my care of the patient were reviewed by me and considered in my medical decision making (see chart for details).     Urinalysis with evidence of a urinary tract infection.  Urine culture pending, treat with Bactrim awaiting results and adjust if needed.  Discussed supportive over-the-counter medications and home care additionally.  Turn for worsening symptoms.  Final Clinical Impressions(s) / UC Diagnoses   Final diagnoses:  Acute lower UTI   Discharge Instructions   None    ED Prescriptions     Medication Sig Dispense Auth. Provider   sulfamethoxazole-trimethoprim (BACTRIM DS) 800-160 MG tablet Take 1 tablet by mouth 2 (two) times daily for 3 days. 6 tablet Particia Nearing, [place]      PDMP not reviewed this encounter.   Particia Nearing, [place] 09/25/22 1950

## 2022-09-25 NOTE — ED Triage Notes (Signed)
Pt c/o UTI sx's, burning with urination ,pressure chills, fatigue, back pain nausea,

## 2022-09-28 LAB — URINE CULTURE: Culture: >=100000 — AB

## 2023-01-07 IMAGING — DX DG FOREARM 2V*L*
2 series · 2 of 2 positions shown · non-contrast
Comparison: None Available.

CLINICAL DATA: Fall landing on the left arm.

EXAM:
LEFT FOREARM - 2 VIEW

[forearm ap]
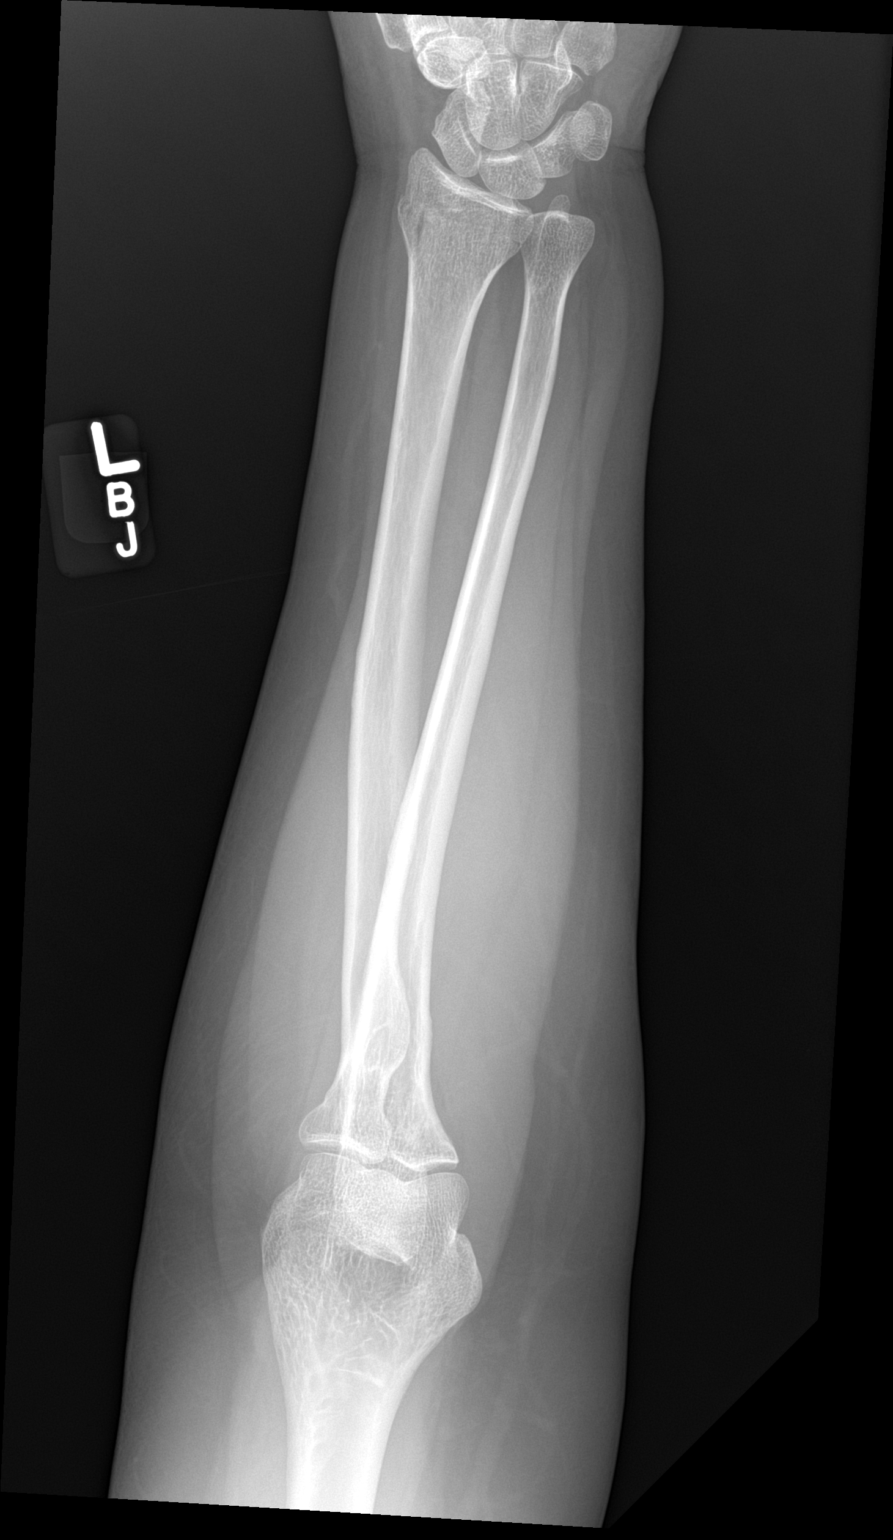

[forearm lat]
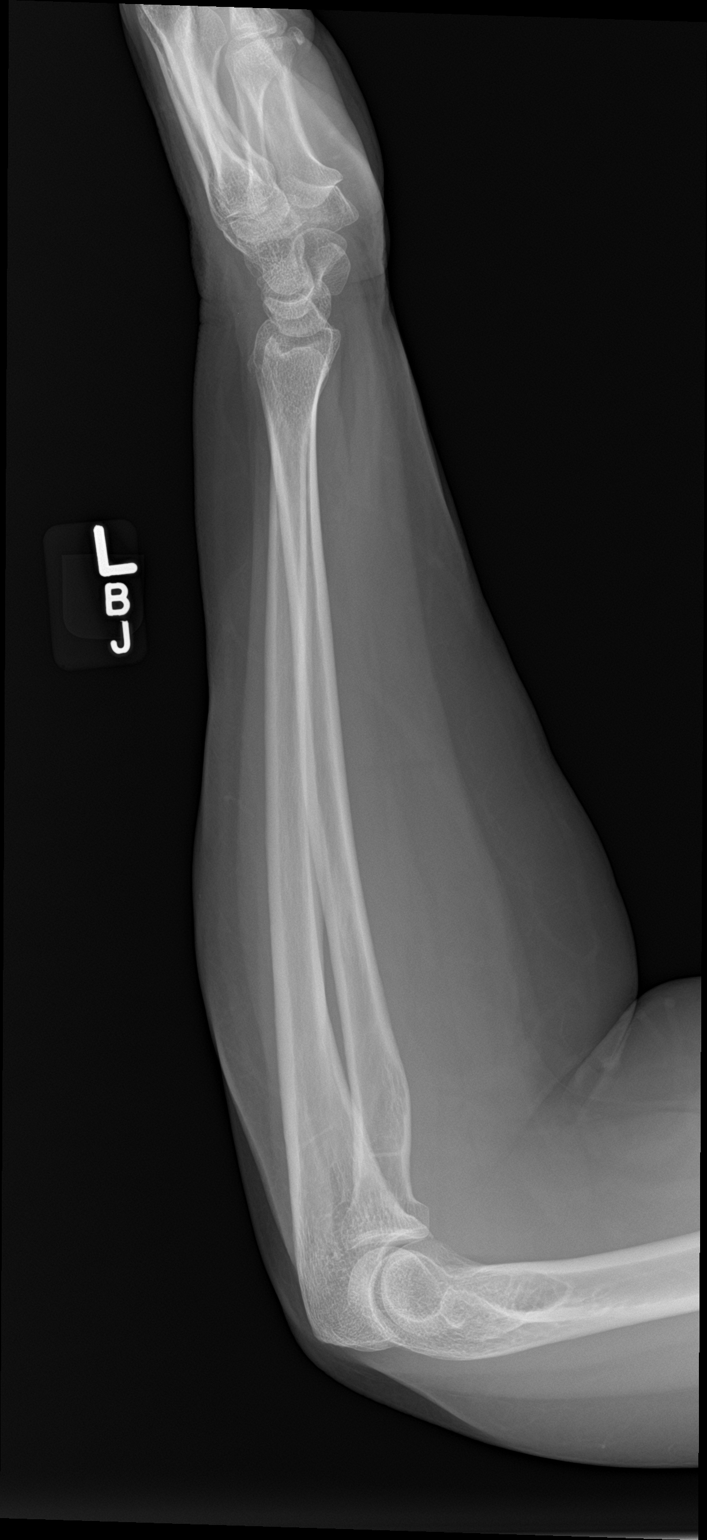

[2 of 2 positions shown; findings below may reference images not displayed]

FINDINGS: Nondisplaced fracture of the distal radius without visible
intra-articular extension. Wrist and elbow joint location.
IMPRESSION: Nondisplaced distal radius fracture.

## 2023-02-14 ENCOUNTER — Encounter: Payer: Self-pay | Admitting: Family Medicine

## 2023-02-14 ENCOUNTER — Telehealth: Payer: Self-pay | Admitting: Family

## 2023-02-14 ENCOUNTER — Ambulatory Visit: Payer: Managed Care, Other (non HMO) | Admitting: Family Medicine

## 2023-02-14 ENCOUNTER — Ambulatory Visit (INDEPENDENT_AMBULATORY_CARE_PROVIDER_SITE_OTHER): Payer: Managed Care, Other (non HMO)

## 2023-02-14 VITALS — BP 158/97 | HR 72 | Ht 64.0 in | Wt 196.0 lb

## 2023-02-14 DIAGNOSIS — K5900 Constipation, unspecified: Secondary | ICD-10-CM | POA: Diagnosis not present

## 2023-02-14 DIAGNOSIS — R030 Elevated blood-pressure reading, without diagnosis of hypertension: Secondary | ICD-10-CM | POA: Diagnosis not present

## 2023-02-14 NOTE — Telephone Encounter (Signed)
 Pt is requesting to switch providers. Wants Neale Burly to be her PCP.  Is Neysa Bonito and Jerrel Ivory both okay with this?

## 2023-02-14 NOTE — Telephone Encounter (Signed)
 Ok with me

## 2023-02-14 NOTE — Patient Instructions (Signed)
 Thank you for coming in to clinic today.  1. Your symptoms are consistent with Constipation, likely cause of your General Abdominal Pain / Cramping. 2. Start with Miralax. First dose 68g (4 capfuls) in 32oz water over 1 to 2 hours for clean out. Next day start 17g or 1 capful daily, may adjust dose up or down by half a capful every few days. Recommend to take this medicine daily for next 1-2 weeks, you may need to use it longer if needed. - Goal is to have soft regular bowel movement 1-3x daily, if too runny or diarrhea, then reduce dose of the medicine to every other day.  Improve water intake, hydration will help Also recommend increased vegetables, fruits, fiber intake Can try daily Metamucil or Fiber supplement at pharmacy over the counter  Follow-up if symptoms are not improving with bowel movements, or if pain worsens, develop fevers, nausea, vomiting.  Please schedule a follow-up appointment in 1 month to follow-up Constipation  If you have any other questions or concerns, please feel free to call the clinic to contact me. You may also schedule an earlier appointment if necessary.  However, if your symptoms get significantly worse, please go to the Emergency Department to seek immediate medical attention.

## 2023-02-14 NOTE — Progress Notes (Signed)
 Subjective:  Patient ID: Holly Petersen, female    DOB: 07/21/1960, 63 y.o.   MRN: [id number]  Patient Care Team: Lavell Bari LABOR, FNP as PCP - General (Family Medicine)   Chief Complaint:  GI Problem   HPI: Holly Petersen is a 63 y.o. female presenting on 02/14/2023 for GI Problem States that she at times is constipated and then will have diarrhea intermittently.  States that it started after her last UTI, she is taking probiotics and is drinking increased fluids. Started a few months ago. Continues to pass gas. Endorses that she feels bloated and full all of the time.  She tried colace and other laxatives last week for the first time. States that she had a BM and today feels more normal than she has felt. She is trying to eat lighter meals and she is skipping breakfast. States that she continues to have bloating, discomfort. Reports that when she feels this full, she then gets short of breath. States that sometimes her stool is yellow with a mucus film. States that this started after she had her gallbladder removed. States that she has since had a normal cologuard.  She wonders if her thyroid is the issue as her daughter has thyroid disease. She also reports inability to lose weight, cold intolerance, and fatigue. She is taking women's one a day, does not know if it has iron in it. Reports that she sometimes has swelling in her right foot. Side sleeper with flat pillow. Denies PND or waking up short of breath.   Relevant past medical, surgical, family, and social history reviewed and updated as indicated.  Allergies and medications reviewed and updated. Data reviewed: Chart in Epic.   Past Medical History:  Diagnosis Date   Kidney stones     Past Surgical History:  Procedure Laterality Date   ABLATION     GALLBLADDER SURGERY     right wrist surgery Right     Social History   Socioeconomic History   Marital status: Married    Spouse name: Not on file   Number of children:  Not on file   Years of education: Not on file   Highest education level: Some college, no degree  Occupational History   Not on file  Tobacco Use   Smoking status: Never   Smokeless tobacco: Never  Vaping Use   Vaping status: Never Used  Substance and Sexual Activity   Alcohol use: No   Drug use: No   Sexual activity: Not on file  Other Topics Concern   Not on file  Social History Narrative   Not on file   Social Drivers of Health   Financial Resource Strain: Patient Declined (02/10/2023)   Overall Financial Resource Strain (CARDIA)    Difficulty of Paying Living Expenses: Patient declined  Food Insecurity: No Food Insecurity (02/10/2023)   Hunger Vital Sign    Worried About Running Out of Food in the Last Year: Never true    Ran Out of Food in the Last Year: Never true  Transportation Needs: No Transportation Needs (02/10/2023)   PRAPARE - Administrator, Civil Service (Medical): No    Lack of Transportation (Non-Medical): No  Physical Activity: Insufficiently Active (02/10/2023)   Exercise Vital Sign    Days of Exercise per Week: 3 days    Minutes of Exercise per Session: 30 min  Stress: No Stress Concern Present (02/10/2023)   [employer] of Occupational Health - Occupational Stress Questionnaire  Feeling of Stress : Only a little  Social Connections: Moderately Integrated (02/10/2023)   Social Connection and Isolation Panel [NHANES]    Frequency of Communication with Friends and Family: More than three times a week    Frequency of Social Gatherings with Friends and Family: Twice a week    Attends Religious Services: 1 to 4 times per year    Active Member of [employer] or Organizations: No    Attends Banker Meetings: Not on file    Marital Status: Married  Intimate Partner Violence: Not on file    Outpatient Encounter Medications as of 02/14/2023  Medication Sig   Ascorbic Acid (VITAMIN C) 100 MG tablet Take 100 mg by mouth daily.    cholecalciferol (VITAMIN D3) 25 MCG (1000 UNIT) tablet Take 1,000 Units by mouth daily.   Multiple Vitamins-Minerals (MULTIVITAMIN WITH MINERALS) tablet Take 1 tablet by mouth daily.   Quercetin 500 MG CAPS    [DISCONTINUED] fluticasone  (FLONASE ) 50 MCG/ACT nasal spray Place 2 sprays into both nostrils daily.   No facility-administered encounter medications on file as of 02/14/2023.    Allergies  Allergen Reactions   Ciprofloxacin Shortness Of Breath   Levaquin [Levofloxacin] Shortness Of Breath   Other Shortness Of Breath    Flouroquinolone     Review of Systems As per HPI  Objective:  BP (!) 158/97   Pulse 72   Ht 5' 4 (1.626 m)   Wt 196 lb (88.9 kg)   SpO2 98%   BMI 33.64 kg/m    Wt Readings from Last 3 Encounters:  02/14/23 196 lb (88.9 kg)  04/26/22 191 lb (86.6 kg)  04/14/22 198 lb (89.8 kg)    Physical Exam Constitutional:      General: She is awake. She is not in acute distress.    Appearance: Normal appearance. She is well-developed and well-groomed. She is not ill-appearing, toxic-appearing or diaphoretic.  Cardiovascular:     Rate and Rhythm: Normal rate and regular rhythm.     Pulses: Normal pulses.          Radial pulses are 2+ on the right side and 2+ on the left side.       Posterior tibial pulses are 2+ on the right side and 2+ on the left side.     Heart sounds: Normal heart sounds. No murmur heard.    No gallop.  Pulmonary:     Effort: Pulmonary effort is normal. No respiratory distress.     Breath sounds: Normal breath sounds. No stridor. No wheezing, rhonchi or rales.  Abdominal:     General: Abdomen is flat. Bowel sounds are decreased. There is no distension.     Palpations: Abdomen is soft. There is no shifting dullness, fluid wave, hepatomegaly, splenomegaly, mass or pulsatile mass.     Tenderness: There is generalized abdominal tenderness.     Hernia: No hernia is present.  Musculoskeletal:     Cervical back: Full passive range of motion  without pain and neck supple.     Right lower leg: No edema.     Left lower leg: No edema.  Skin:    General: Skin is warm.     Capillary Refill: Capillary refill takes less than 2 seconds.  Neurological:     General: No focal deficit present.     Mental Status: She is alert, oriented to person, place, and time and easily aroused. Mental status is at baseline.     GCS: GCS eye subscore is  4. GCS verbal subscore is 5. GCS motor subscore is 6.     Motor: No weakness.  Psychiatric:        Attention and Perception: Attention and perception normal.        Mood and Affect: Mood and affect normal.        Speech: Speech normal.        Behavior: Behavior normal. Behavior is cooperative.        Thought Content: Thought content normal. Thought content does not include homicidal or suicidal ideation. Thought content does not include homicidal or suicidal plan.        Cognition and Memory: Cognition and memory normal.        Judgment: Judgment normal.     Results for orders placed or performed during the hospital encounter of 09/25/22  POCT urinalysis dipstick   Collection Time: 09/25/22  1:03 PM  Result Value Ref Range   Color, UA light yellow (A) yellow   Clarity, UA clear clear   Glucose, UA negative negative mg/dL   Bilirubin, UA negative negative   Ketones, POC UA negative negative mg/dL   Spec Grav, UA 8.989 8.989 - 1.025   Blood, UA small (A) negative   pH, UA 6.5 5.0 - 8.0   Protein Ur, POC negative negative mg/dL   Urobilinogen, UA 0.2 0.2 or 1.0 E.U./dL   Nitrite, UA Positive (A) Negative   Leukocytes, UA Small (1+) (A) Negative  Urine Culture   Collection Time: 09/25/22  1:05 PM   Specimen: Urine, Clean Catch  Result Value Ref Range   Specimen Description      URINE, CLEAN CATCH Performed at [facility], [address] [place], [place] [address]    Special Requests      NONE Performed at [facility], [address], [place], [place] [address]    Culture >=100,000  COLONIES/mL ESCHERICHIA COLI (A)    Report Status 09/28/2022 FINAL    Organism ID, Bacteria ESCHERICHIA COLI (A)       Susceptibility   Escherichia coli - MIC*    AMPICILLIN >=32 RESISTANT Resistant     CEFAZOLIN <=4 SENSITIVE Sensitive     CEFEPIME <=0.12 SENSITIVE Sensitive     CEFTRIAXONE <=0.25 SENSITIVE Sensitive     CIPROFLOXACIN <=0.25 SENSITIVE Sensitive     GENTAMICIN <=1 SENSITIVE Sensitive     IMIPENEM <=0.25 SENSITIVE Sensitive     NITROFURANTOIN  <=16 SENSITIVE Sensitive     TRIMETH /SULFA  <=20 SENSITIVE Sensitive     AMPICILLIN/SULBACTAM 8 SENSITIVE Sensitive     PIP/TAZO <=4 SENSITIVE Sensitive     * >=100,000 COLONIES/mL ESCHERICHIA COLI       02/14/2023    1:20 PM 04/26/2022    9:51 AM 08/16/2021   12:30 PM 08/16/2021   12:29 PM 01/25/2020   12:02 PM  Depression screen PHQ 2/9  Decreased Interest 0 0  0 0  Down, Depressed, Hopeless 0 0  0 0  PHQ - 2 Score 0 0  0 0  Altered sleeping 2 1 0    Tired, decreased energy 3 1 1     Change in appetite 1 0 0    Feeling bad or failure about yourself  0 0 0    Trouble concentrating 0 0 0    Moving slowly or fidgety/restless 0 0 0    Suicidal thoughts 0 0 0    PHQ-9 Score 6 2     Difficult doing work/chores  Not difficult at all Not difficult  at all         04/26/2022    9:51 AM 08/16/2021   12:29 PM  GAD 7 : Generalized Anxiety Score  Nervous, Anxious, on Edge 0 1  Control/stop worrying 0 1  Worry too much - different things 0 0  Trouble relaxing 0 0  Restless 0 0  Easily annoyed or irritable 0 0  Afraid - awful might happen 0 0  Total GAD 7 Score 0 2  Anxiety Difficulty Not difficult at all Not difficult at all    Pertinent labs & imaging results that were available during my care of the patient were reviewed by me and considered in my medical decision making.  Assessment & Plan:  Dorlis was seen today for gi problem.  Diagnoses and all orders for this visit:  Constipation, unspecified constipation  type Imaging and labs as below. Will communicate results to patient once available. Will await results to determine next steps.  Discussed with patient that likely constipation. Provided written handout.  -     DG Abd 1 View -     TSH  Elevated BP without diagnosis of hypertension Elevated BP today in office. Discussed with patient monitoring BP at home. Provided BP log to patient in clinic today. Instructed pt to take BP first thing in the morning after sitting for 5 minutes with feet flat on the floor, arm at heart level. Discussed with patient options for BP cuff at [place], [employer] [facility], [facility]. Pt verbalized they were able to obtain BP cuff. Will review measurements with patient at follow up and determine plan for BP management.    Continue all other maintenance medications.  Follow up plan: Return if symptoms worsen or fail to improve.   Continue healthy lifestyle choices, including diet (rich in fruits, vegetables, and lean proteins, and low in salt and simple carbohydrates) and exercise (at least 30 minutes of moderate physical activity daily).  Written and verbal instructions provided   The above assessment and management plan was discussed with the patient. The patient verbalized understanding of and has agreed to the management plan. Patient is aware to call the clinic if they develop any new symptoms or if symptoms persist or worsen. Patient is aware when to return to the clinic for a follow-up visit. Patient educated on when it is appropriate to go to the emergency department.   Marry Kins, DNP-FNP [facility] [address] [place], [place] [address] [phone]

## 2023-02-15 LAB — TSH: TSH: 2.11 u[IU]/mL (ref 0.450–4.500)

## 2023-02-18 ENCOUNTER — Encounter: Payer: Self-pay | Admitting: Family Medicine

## 2023-02-18 NOTE — Progress Notes (Signed)
 Labs normal. If symptoms continue, please follow up.

## 2023-02-21 ENCOUNTER — Encounter: Payer: Self-pay | Admitting: Family Medicine

## 2023-02-21 NOTE — Progress Notes (Signed)
 Moderate stool burden present. Patient provided information for bowel cleanout at visit. If symptoms are continuing, please follow up.

## 2023-03-20 ENCOUNTER — Ambulatory Visit: Payer: Managed Care, Other (non HMO) | Admitting: Family Medicine

## 2023-03-20 ENCOUNTER — Encounter: Payer: Self-pay | Admitting: Family Medicine

## 2023-03-20 VITALS — BP 142/86 | HR 84 | Temp 98.0°F | Ht 64.0 in | Wt 196.0 lb

## 2023-03-20 DIAGNOSIS — E7800 Pure hypercholesterolemia, unspecified: Secondary | ICD-10-CM | POA: Insufficient documentation

## 2023-03-20 DIAGNOSIS — R600 Localized edema: Secondary | ICD-10-CM

## 2023-03-20 DIAGNOSIS — R051 Acute cough: Secondary | ICD-10-CM | POA: Insufficient documentation

## 2023-03-20 DIAGNOSIS — M8580 Other specified disorders of bone density and structure, unspecified site: Secondary | ICD-10-CM | POA: Diagnosis not present

## 2023-03-20 DIAGNOSIS — I10 Essential (primary) hypertension: Secondary | ICD-10-CM | POA: Diagnosis not present

## 2023-03-20 DIAGNOSIS — E876 Hypokalemia: Secondary | ICD-10-CM

## 2023-03-20 MED ORDER — HYDROCHLOROTHIAZIDE 25 MG PO TABS: 25.0000 mg | ORAL_TABLET | Freq: Every day | ORAL | 0 refills | Status: DC

## 2023-03-20 MED ORDER — BENZONATATE 100 MG PO CAPS: 100.0000 mg | ORAL_CAPSULE | Freq: Three times a day (TID) | ORAL | 0 refills | Status: DC | PRN

## 2023-03-20 NOTE — Progress Notes (Signed)
 New Patient Office Visit  Subjective   Patient ID: Holly Petersen, female    DOB: [birth year]  Age: 63 y.o. MRN: [id number]  CC:  Chief Complaint  Patient presents with   Hypertension   HPI Catrina Fellenz presents to establish care. She has a PMH of Osteopenia  Taking multivitamin with calcium , vitamin D , vitamin C and Querectin 500 mg. States that she was having GI symptoms with taking Os-cal.   Hypertension Has BP monitor at home Yes BP at home average 140-150/80s ROS Denies anxiety, fatigue, peripheral edema, changes to vision, chest pain, headaches, palpitations, sweats, SOB, PND, orthopnea Headaches due to sinus. Endorses intermittent feelings of heart racing, some peripheral edema in the evenings. 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 She does not walk much at work. Does not wear compression stockings.  Meds none  CAD risks hypertension  Sinus drainage  States that it has been going on for 3 weeks. Reports that at first she was much more symptomatic. Reports fevers, cough, rhinorrhea. Her husband then became sick and was positive for covid. She was never tested. She states that she has much improved, however has some lingering throat irritation, PND and cough. Has been taking flonase , advil, and nyquil.    Outpatient Encounter Medications as of 03/20/2023  Medication Sig   Ascorbic Acid (VITAMIN C) 100 MG tablet Take 100 mg by mouth daily.   cholecalciferol (VITAMIN D3) 25 MCG (1000 UNIT) tablet Take 1,000 Units by mouth daily.   Multiple Vitamins-Minerals (MULTIVITAMIN WITH MINERALS) tablet Take 1 tablet by mouth daily.   Quercetin 500 MG CAPS    No facility-administered encounter medications on file as of 03/20/2023.    Past Medical History:  Diagnosis Date   Kidney stones     Past Surgical History:  Procedure Laterality Date   ABLATION     GALLBLADDER SURGERY     right wrist surgery Right     Family History  Problem Relation Age of Onset   COPD Mother     Hypertension Mother    Thyroid disease Mother    Heart disease Father    Thyroid disease Brother    Diabetes Daughter    Thyroid disease Daughter    Arthritis Maternal Grandmother    Diabetes Paternal Grandmother     Social History   Socioeconomic History   Marital status: Married    Spouse name: Not on file   Number of children: Not on file   Years of education: Not on file   Highest education level: Some college, no degree  Occupational History   Not on file  Tobacco Use   Smoking status: Never   Smokeless tobacco: Never  Vaping Use   Vaping status: Never Used  Substance and Sexual Activity   Alcohol use: No   Drug use: No   Sexual activity: Not on file  Other Topics Concern   Not on file  Social History Narrative   Not on file   Social Drivers of Health   Financial Resource Strain: Patient Declined (02/10/2023)   Overall Financial Resource Strain (CARDIA)    Difficulty of Paying Living Expenses: Patient declined  Food Insecurity: No Food Insecurity (02/10/2023)   Hunger Vital Sign    Worried About Running Out of Food in the Last Year: Never true    Ran Out of Food in the Last Year: Never true  Transportation Needs: No Transportation Needs (02/10/2023)   PRAPARE - Administrator, Civil Service (Medical): No  Lack of Transportation (Non-Medical): No  Physical Activity: Insufficiently Active (02/10/2023)   Exercise Vital Sign    Days of Exercise per Week: 3 days    Minutes of Exercise per Session: 30 min  Stress: No Stress Concern Present (02/10/2023)   [employer] of Occupational Health - Occupational Stress Questionnaire    Feeling of Stress : Only a little  Social Connections: Moderately Integrated (02/10/2023)   Social Connection and Isolation Panel [NHANES]    Frequency of Communication with Friends and Family: More than three times a week    Frequency of Social Gatherings with Friends and Family: Twice a week    Attends Religious  Services: 1 to 4 times per year    Active Member of [employer] or Organizations: No    Attends Engineer, Structural: Not on file    Marital Status: Married  Catering Manager Violence: Not on file    ROS As per HPI   Objective   BP (!) 142/86   Pulse 84   Temp 98 F (36.7 C)   Ht 5' 4 (1.626 m)   Wt 196 lb (88.9 kg)   SpO2 99%   BMI 33.64 kg/m   Physical Exam Constitutional:      General: She is awake. She is not in acute distress.    Appearance: Normal appearance. She is well-developed and well-groomed. She is obese. She is not ill-appearing, toxic-appearing or diaphoretic.  Cardiovascular:     Rate and Rhythm: Normal rate and regular rhythm.     Pulses: Normal pulses.          Radial pulses are 2+ on the right side and 2+ on the left side.       Posterior tibial pulses are 2+ on the right side and 2+ on the left side.     Heart sounds: Normal heart sounds. No murmur heard.    No gallop.     Comments: Trace edema bilateral LE  Pulmonary:     Effort: Pulmonary effort is normal. No respiratory distress.     Breath sounds: Normal breath sounds. No stridor. No wheezing, rhonchi or rales.  Musculoskeletal:     Cervical back: Full passive range of motion without pain and neck supple.     Right lower leg: Edema present.     Left lower leg: Edema present.  Skin:    General: Skin is warm.     Capillary Refill: Capillary refill takes less than 2 seconds.  Neurological:     General: No focal deficit present.     Mental Status: She is alert, oriented to person, place, and time and easily aroused. Mental status is at baseline.     GCS: GCS eye subscore is 4. GCS verbal subscore is 5. GCS motor subscore is 6.     Motor: No weakness.  Psychiatric:        Attention and Perception: Attention and perception normal.        Mood and Affect: Mood and affect normal.        Speech: Speech normal.        Behavior: Behavior normal. Behavior is cooperative.        Thought Content:  Thought content normal. Thought content does not include homicidal or suicidal ideation. Thought content does not include homicidal or suicidal plan.        Cognition and Memory: Cognition and memory normal.        Judgment: Judgment normal.  03/20/2023    3:08 PM 02/14/2023    1:20 PM 04/26/2022    9:51 AM  Depression screen PHQ 2/9  Decreased Interest 0 0 0  Down, Depressed, Hopeless 0 0 0  PHQ - 2 Score 0 0 0  Altered sleeping 1 2 1   Tired, decreased energy 1 3 1   Change in appetite 0 1 0  Feeling bad or failure about yourself  0 0 0  Trouble concentrating 0 0 0  Moving slowly or fidgety/restless 0 0 0  Suicidal thoughts 0 0 0  PHQ-9 Score 2 6 2   Difficult doing work/chores Not difficult at all  Not difficult at all      03/20/2023    3:08 PM 02/14/2023    1:21 PM 04/26/2022    9:51 AM 08/16/2021   12:29 PM  GAD 7 : Generalized Anxiety Score  Nervous, Anxious, on Edge 0 0 0 1  Control/stop worrying 0 0 0 1  Worry too much - different things 0 0 0 0  Trouble relaxing 1 1 0 0  Restless 0 0 0 0  Easily annoyed or irritable 0 0 0 0  Afraid - awful might happen 0 0 0 0  Total GAD 7 Score 1 1 0 2  Anxiety Difficulty Not difficult at all  Not difficult at all Not difficult at all   Assessment & Plan:  1. Primary hypertension (Primary) Will start medication as below given history of osteopenia and slight bilateral lower extremity edema. Discussed with patient to wait until labs are back to monitor potassium prior to starting. Patient to continue to monitor BP at home. Labs as below. Will communicate results to patient once available. Will await results to determine next steps.  - hydrochlorothiazide  (HYDRODIURIL ) 25 MG tablet; Take 1 tablet (25 mg total) by mouth daily.  Dispense: 90 tablet; Refill: 0 - CBC with Differential/Platelet - CMP14+EGFR  2. Osteopenia, unspecified location As above. Patient to continue preventative therapy. Labs as below. Will communicate results to  patient once available. Will await results to determine next steps.  - hydrochlorothiazide  (HYDRODIURIL ) 25 MG tablet; Take 1 tablet (25 mg total) by mouth daily.  Dispense: 90 tablet; Refill: 0 - VITAMIN D  25 Hydroxy (Vit-D Deficiency, Fractures)  3. Pure hypercholesterolemia Labs as below. Will communicate results to patient once available. Will await results to determine next steps.  Not fasting  - Lipid panel  4. Acute cough Will start medication as below. Suspect that patient has lingering cough. Overall reassuring exam.  - benzonatate  (TESSALON  PERLES) 100 MG capsule; Take 1 capsule (100 mg total) by mouth 3 (three) times daily as needed.  Dispense: 20 capsule; Refill: 0  5. Hypokalemia Will check labs as above. Reviewed labs from 04/14/22, K 3.2. Will not start hydrochlorothiazide  until labs result.   6. Bilateral lower extremity edema As above. Will start hydrochlorothiazide  to assist with symptoms.   The above assessment and management plan was discussed with the patient. The patient verbalized understanding of and has agreed to the management plan using shared-decision making. Patient is aware to call the clinic if they develop any new symptoms or if symptoms fail to improve or worsen. Patient is aware when to return to the clinic for a follow-up visit. Patient educated on when it is appropriate to go to the emergency department.   Return in about 6 weeks (around 05/01/2023) for Chronic Condition Follow up.   Marry Kins, DNP-FNP [facility] [address] [place],  [place] [address] [phone]

## 2023-03-21 ENCOUNTER — Encounter: Payer: Self-pay | Admitting: Family Medicine

## 2023-03-21 LAB — CBC WITH DIFFERENTIAL/PLATELET
Basophils Absolute: 0.1 10*3/uL (ref 0.0–0.2)
Basos: 1 %
EOS (ABSOLUTE): 0.3 10*3/uL (ref 0.0–0.4)
Eos: 3 %
Hematocrit: 40.2 % (ref 34.0–46.6)
Hemoglobin: 13.1 g/dL (ref 11.1–15.9)
Immature Grans (Abs): 0 10*3/uL (ref 0.0–0.1)
Immature Granulocytes: 0 %
Lymphocytes Absolute: 3.5 10*3/uL — ABNORMAL HIGH (ref 0.7–3.1)
Lymphs: 31 %
MCH: 29.8 pg (ref 26.6–33.0)
MCHC: 32.6 g/dL (ref 31.5–35.7)
MCV: 91 fL (ref 79–97)
Monocytes Absolute: 0.8 10*3/uL (ref 0.1–0.9)
Monocytes: 7 %
Neutrophils Absolute: 6.5 10*3/uL (ref 1.4–7.0)
Neutrophils: 58 %
Platelets: 420 10*3/uL (ref 150–450)
RBC: 4.4 x10E6/uL (ref 3.77–5.28)
RDW: 12.5 % (ref 11.7–15.4)
WBC: 11.4 10*3/uL — ABNORMAL HIGH (ref 3.4–10.8)

## 2023-03-21 LAB — CMP14+EGFR
ALT: 18 [IU]/L (ref 0–32)
AST: 19 [IU]/L (ref 0–40)
Albumin: 4.1 g/dL (ref 3.8–4.9)
Alkaline Phosphatase: 126 [IU]/L — ABNORMAL HIGH (ref 44–121)
BUN/Creatinine Ratio: 16 (ref 12–28)
BUN: 13 mg/dL (ref 8–27)
Bilirubin Total: 0.3 mg/dL (ref 0.0–1.2)
CO2: 24 mmol/L (ref 20–29)
Calcium: 9.5 mg/dL (ref 8.7–10.3)
Chloride: 103 mmol/L (ref 96–106)
Creatinine, Ser: 0.81 mg/dL (ref 0.57–1.00)
Globulin, Total: 3 g/dL (ref 1.5–4.5)
Glucose: 90 mg/dL (ref 70–99)
Potassium: 4.5 mmol/L (ref 3.5–5.2)
Sodium: 142 mmol/L (ref 134–144)
Total Protein: 7.1 g/dL (ref 6.0–8.5)
eGFR: 83 mL/min/{1.73_m2} (ref >59)

## 2023-03-21 LAB — LIPID PANEL
Chol/HDL Ratio: 5.4 {ratio} — ABNORMAL HIGH (ref 0.0–4.4)
Cholesterol, Total: 263 mg/dL — ABNORMAL HIGH (ref 100–199)
HDL: 49 mg/dL (ref >39)
LDL Chol Calc (NIH): 201 mg/dL — ABNORMAL HIGH (ref 0–99)
Triglycerides: 78 mg/dL (ref 0–149)
VLDL Cholesterol Cal: 13 mg/dL (ref 5–40)

## 2023-03-21 LAB — VITAMIN D 25 HYDROXY (VIT D DEFICIENCY, FRACTURES): Vit D, 25-Hydroxy: 45.3 ng/mL (ref 30.0–100.0)

## 2023-03-21 NOTE — Progress Notes (Signed)
 Slightly elevated WBC, consistent with previous. If she continues, we can follow up with hematology as she does not have a smoking history which would correlate with elevated WBC. Slightly elevated alk phos, likely due to nonfasting status. We can continue to monitor and if it continues on fasting labs, will order isoenzymes for further eval. Cholesterol is elevated. Diet encouraged - increase intake of fresh fruits and vegetables, increase intake of lean proteins. Bake, broil, or grill foods. Avoid fried, greasy, and fatty foods. Avoid fast foods. Increase intake of fiber-rich whole grains. Exercise encouraged - at least 150 minutes per week and advance as tolerated. Can also try red yeast rice and we will recheck in 3 months. Risk score is borderline for starting statin. Can send if she would like.  The 10-year ASCVD risk score (Arnett DK, et al., 2019) is: 7.2%

## 2023-04-16 ENCOUNTER — Ambulatory Visit: Admitting: Family Medicine

## 2023-04-16 ENCOUNTER — Encounter: Payer: Self-pay | Admitting: Family Medicine

## 2023-04-16 ENCOUNTER — Other Ambulatory Visit

## 2023-04-16 VITALS — BP 129/88 | HR 95 | Temp 98.7°F | Ht 64.0 in | Wt 196.0 lb

## 2023-04-16 DIAGNOSIS — R002 Palpitations: Secondary | ICD-10-CM | POA: Diagnosis not present

## 2023-04-16 DIAGNOSIS — D72829 Elevated white blood cell count, unspecified: Secondary | ICD-10-CM

## 2023-04-16 NOTE — Progress Notes (Signed)
 Subjective:  Patient ID: Holly Petersen, female    DOB: Jul 15, 1960, 63 y.o.   MRN: [id number]  Patient Care Team: Arrie Senate, FNP as PCP - General (Family Medicine)   Chief Complaint:  Irregular Heart Beat  HPI: Holly Petersen is a 63 y.o. female presenting on 04/16/2023 for Irregular Heart Beat Started hydrochlorothiazide last week.  States that she had increased heart rate last night and checked her BP with HR. BP was 121/81, HR 108. Sitting at desk had increased rate again and HR at work, it was 106 and 112 at work. States that it then resolved and came back down. Denies chest pain, chest tightness, shortness of breath. Endorses slight dizziness this morning. Denies N/V, some diarrhea. States that she is very well hydrated. Drinks 1-2 cups of coffee or tea daily.   Relevant past medical, surgical, family, and social history reviewed and updated as indicated.  Allergies and medications reviewed and updated. Data reviewed: Chart in Epic.   Past Medical History:  Diagnosis Date   Kidney stones     Past Surgical History:  Procedure Laterality Date   ABLATION     GALLBLADDER SURGERY     right wrist surgery Right     Social History   Socioeconomic History   Marital status: Married    Spouse name: Not on file   Number of children: Not on file   Years of education: Not on file   Highest education level: Some college, no degree  Occupational History   Not on file  Tobacco Use   Smoking status: Never   Smokeless tobacco: Never  Vaping Use   Vaping status: Never Used  Substance and Sexual Activity   Alcohol use: No   Drug use: No   Sexual activity: Not on file  Other Topics Concern   Not on file  Social History Narrative   Not on file   Social Drivers of Health   Financial Resource Strain: Patient Declined (02/10/2023)   Overall Financial Resource Strain (CARDIA)    Difficulty of Paying Living Expenses: Patient declined  Food Insecurity: No Food  Insecurity (02/10/2023)   Hunger Vital Sign    Worried About Running Out of Food in the Last Year: Never true    Ran Out of Food in the Last Year: Never true  Transportation Needs: No Transportation Needs (02/10/2023)   PRAPARE - Administrator, Civil Service (Medical): No    Lack of Transportation (Non-Medical): No  Physical Activity: Insufficiently Active (02/10/2023)   Exercise Vital Sign    Days of Exercise per Week: 3 days    Minutes of Exercise per Session: 30 min  Stress: No Stress Concern Present (02/10/2023)   [employer] of Occupational Health - Occupational Stress Questionnaire    Feeling of Stress : Only a little  Social Connections: Moderately Integrated (02/10/2023)   Social Connection and Isolation Panel [NHANES]    Frequency of Communication with Friends and Family: More than three times a week    Frequency of Social Gatherings with Friends and Family: Twice a week    Attends Religious Services: 1 to 4 times per year    Active Member of [employer] or Organizations: No    Attends Banker Meetings: Not on file    Marital Status: Married  Intimate Partner Violence: Not on file    Outpatient Encounter Medications as of 04/16/2023  Medication Sig   Ascorbic Acid (VITAMIN C) 100 MG tablet Take 100  mg by mouth daily.   benzonatate (TESSALON PERLES) 100 MG capsule Take 1 capsule (100 mg total) by mouth 3 (three) times daily as needed.   cholecalciferol (VITAMIN D3) 25 MCG (1000 UNIT) tablet Take 1,000 Units by mouth daily.   hydrochlorothiazide (HYDRODIURIL) 25 MG tablet Take 1 tablet (25 mg total) by mouth daily.   Multiple Vitamins-Minerals (MULTIVITAMIN WITH MINERALS) tablet Take 1 tablet by mouth daily.   Quercetin 500 MG CAPS    No facility-administered encounter medications on file as of 04/16/2023.    Allergies  Allergen Reactions   Ciprofloxacin Shortness Of Breath   Levaquin [Levofloxacin] Shortness Of Breath   Other Shortness Of Breath     Flouroquinolone     Review of Systems As per HPI  Objective:  BP 129/88   Pulse 95   Temp 98.7 F (37.1 C)   Ht 5\' 4"  (1.626 m)   Wt 196 lb (88.9 kg)   SpO2 95%   BMI 33.64 kg/m    Wt Readings from Last 3 Encounters:  04/16/23 196 lb (88.9 kg)  03/20/23 196 lb (88.9 kg)  02/14/23 196 lb (88.9 kg)    Physical Exam Constitutional:      General: She is awake. She is not in acute distress.    Appearance: Normal appearance. She is well-developed and well-groomed. She is obese. She is not ill-appearing, toxic-appearing or diaphoretic.  Cardiovascular:     Rate and Rhythm: Normal rate and regular rhythm.     Pulses: Normal pulses.          Radial pulses are 2+ on the right side and 2+ on the left side.       Posterior tibial pulses are 2+ on the right side and 2+ on the left side.     Heart sounds: Normal heart sounds. No murmur heard.    No gallop.  Pulmonary:     Effort: Pulmonary effort is normal. No respiratory distress.     Breath sounds: Normal breath sounds. No stridor. No wheezing, rhonchi or rales.  Musculoskeletal:     Cervical back: Full passive range of motion without pain and neck supple.     Right lower leg: No edema.     Left lower leg: No edema.  Skin:    General: Skin is warm.     Capillary Refill: Capillary refill takes less than 2 seconds.  Neurological:     General: No focal deficit present.     Mental Status: She is alert, oriented to person, place, and time and easily aroused. Mental status is at baseline.     GCS: GCS eye subscore is 4. GCS verbal subscore is 5. GCS motor subscore is 6.     Motor: No weakness.  Psychiatric:        Attention and Perception: Attention and perception normal.        Mood and Affect: Mood and affect normal.        Speech: Speech normal.        Behavior: Behavior normal. Behavior is cooperative.        Thought Content: Thought content normal. Thought content does not include homicidal or suicidal ideation. Thought  content does not include homicidal or suicidal plan.        Cognition and Memory: Cognition and memory normal.        Judgment: Judgment normal.     Results for orders placed or performed in visit on 03/20/23  CBC with Differential/Platelet   Collection Time:  03/20/23  3:40 PM  Result Value Ref Range   WBC 11.4 (H) 3.4 - 10.8 x10E3/uL   RBC 4.40 3.77 - 5.28 x10E6/uL   Hemoglobin 13.1 11.1 - 15.9 g/dL   Hematocrit 16.1 09.6 - 46.6 %   MCV 91 79 - 97 fL   MCH 29.8 26.6 - 33.0 pg   MCHC 32.6 31.5 - 35.7 g/dL   RDW 04.5 40.9 - 81.1 %   Platelets 420 150 - 450 x10E3/uL   Neutrophils 58 Not Estab. %   Lymphs 31 Not Estab. %   Monocytes 7 Not Estab. %   Eos 3 Not Estab. %   Basos 1 Not Estab. %   Neutrophils Absolute 6.5 1.4 - 7.0 x10E3/uL   Lymphocytes Absolute 3.5 (H) 0.7 - 3.1 x10E3/uL   Monocytes Absolute 0.8 0.1 - 0.9 x10E3/uL   EOS (ABSOLUTE) 0.3 0.0 - 0.4 x10E3/uL   Basophils Absolute 0.1 0.0 - 0.2 x10E3/uL   Immature Granulocytes 0 Not Estab. %   Immature Grans (Abs) 0.0 0.0 - 0.1 x10E3/uL  CMP14+EGFR   Collection Time: 03/20/23  3:40 PM  Result Value Ref Range   Glucose 90 70 - 99 mg/dL   BUN 13 8 - 27 mg/dL   Creatinine, Ser 9.14 0.57 - 1.00 mg/dL   eGFR 83 >78 GN/FAO/1.30   BUN/Creatinine Ratio 16 12 - 28   Sodium 142 134 - 144 mmol/L   Potassium 4.5 3.5 - 5.2 mmol/L   Chloride 103 96 - 106 mmol/L   CO2 24 20 - 29 mmol/L   Calcium 9.5 8.7 - 10.3 mg/dL   Total Protein 7.1 6.0 - 8.5 g/dL   Albumin 4.1 3.8 - 4.9 g/dL   Globulin, Total 3.0 1.5 - 4.5 g/dL   Bilirubin Total 0.3 0.0 - 1.2 mg/dL   Alkaline Phosphatase 126 (H) 44 - 121 IU/L   AST 19 0 - 40 IU/L   ALT 18 0 - 32 IU/L  Lipid panel   Collection Time: 03/20/23  3:40 PM  Result Value Ref Range   Cholesterol, Total 263 (H) 100 - 199 mg/dL   Triglycerides 78 0 - 149 mg/dL   HDL 49 >86 mg/dL   VLDL Cholesterol Cal 13 5 - 40 mg/dL   LDL Chol Calc (NIH) 578 (H) 0 - 99 mg/dL   LDL CALC COMMENT: Comment     Chol/HDL Ratio 5.4 (H) 0.0 - 4.4 ratio  VITAMIN D 25 Hydroxy (Vit-D Deficiency, Fractures)   Collection Time: 03/20/23  3:40 PM  Result Value Ref Range   Vit D, 25-Hydroxy 45.3 30.0 - 100.0 ng/mL       03/20/2023    3:08 PM 02/14/2023    1:20 PM 04/26/2022    9:51 AM 08/16/2021   12:30 PM 08/16/2021   12:29 PM  Depression screen PHQ 2/9  Decreased Interest 0 0 0  0  Down, Depressed, Hopeless 0 0 0  0  PHQ - 2 Score 0 0 0  0  Altered sleeping 1 2 1  0   Tired, decreased energy 1 3 1 1    Change in appetite 0 1 0 0   Feeling bad or failure about yourself  0 0 0 0   Trouble concentrating 0 0 0 0   Moving slowly or fidgety/restless 0 0 0 0   Suicidal thoughts 0 0 0 0   PHQ-9 Score 2 6 2     Difficult doing work/chores Not difficult at all  Not difficult at all Not  difficult at all        03/20/2023    3:08 PM 02/14/2023    1:21 PM 04/26/2022    9:51 AM 08/16/2021   12:29 PM  GAD 7 : Generalized Anxiety Score  Nervous, Anxious, on Edge 0 0 0 1  Control/stop worrying 0 0 0 1  Worry too much - different things 0 0 0 0  Trouble relaxing 1 1 0 0  Restless 0 0 0 0  Easily annoyed or irritable 0 0 0 0  Afraid - awful might happen 0 0 0 0  Total GAD 7 Score 1 1 0 2  Anxiety Difficulty Not difficult at all  Not difficult at all Not difficult at all   Pertinent labs & imaging results that were available during my care of the patient were reviewed by me and considered in my medical decision making.  Assessment & Plan:  Harleen was seen today for irregular heart beat.  Diagnoses and all orders for this visit:  Palpitations Labs and monitor as below. Will communicate results to patient once available. Will await results to determine next steps.  -     LONG TERM MONITOR (3-14 DAYS); Future -     Anemia Profile B -     CMP14+EGFR -     TSH -     VITAMIN D 25 Hydroxy (Vit-D Deficiency, Fractures) -     Magnesium   Continue all other maintenance medications.  Follow up plan: Return if  symptoms worsen or fail to improve.   Continue healthy lifestyle choices, including diet (rich in fruits, vegetables, and lean proteins, and low in salt and simple carbohydrates) and exercise (at least 30 minutes of moderate physical activity daily).  Written and verbal instructions provided   The above assessment and management plan was discussed with the patient. The patient verbalized understanding of and has agreed to the management plan. Patient is aware to call the clinic if they develop any new symptoms or if symptoms persist or worsen. Patient is aware when to return to the clinic for a follow-up visit. Patient educated on when it is appropriate to go to the emergency department.   Neale Burly, DNP-FNP [facility] [address] [place], [place] [address] [phone]

## 2023-04-17 ENCOUNTER — Encounter: Payer: Self-pay | Admitting: Family Medicine

## 2023-04-17 LAB — CMP14+EGFR
ALT: 17 IU/L (ref 0–32)
AST: 19 IU/L (ref 0–40)
Albumin: 4.4 g/dL (ref 3.8–4.9)
Alkaline Phosphatase: 134 IU/L — ABNORMAL HIGH (ref 44–121)
BUN/Creatinine Ratio: 16 (ref 12–28)
BUN: 12 mg/dL (ref 8–27)
Bilirubin Total: 0.3 mg/dL (ref 0.0–1.2)
CO2: 25 mmol/L (ref 20–29)
Calcium: 9.7 mg/dL (ref 8.7–10.3)
Chloride: 99 mmol/L (ref 96–106)
Creatinine, Ser: 0.75 mg/dL (ref 0.57–1.00)
Globulin, Total: 2.9 g/dL (ref 1.5–4.5)
Glucose: 88 mg/dL (ref 70–99)
Potassium: 4.2 mmol/L (ref 3.5–5.2)
Sodium: 142 mmol/L (ref 134–144)
Total Protein: 7.3 g/dL (ref 6.0–8.5)
eGFR: 91 mL/min/{1.73_m2} (ref >59)

## 2023-04-17 LAB — ANEMIA PROFILE B
Basophils Absolute: 0.1 10*3/uL (ref 0.0–0.2)
Basos: 1 %
EOS (ABSOLUTE): 0.3 10*3/uL (ref 0.0–0.4)
Eos: 2 %
Ferritin: 325 ng/mL — ABNORMAL HIGH (ref 15–150)
Folate: 13.4 ng/mL (ref >3.0)
Hematocrit: 42 % (ref 34.0–46.6)
Hemoglobin: 13.6 g/dL (ref 11.1–15.9)
Immature Grans (Abs): 0 10*3/uL (ref 0.0–0.1)
Immature Granulocytes: 0 %
Iron Saturation: 19 % (ref 15–55)
Iron: 60 ug/dL (ref 27–159)
Lymphocytes Absolute: 4.5 10*3/uL — ABNORMAL HIGH (ref 0.7–3.1)
Lymphs: 33 %
MCH: 30.1 pg (ref 26.6–33.0)
MCHC: 32.4 g/dL (ref 31.5–35.7)
MCV: 93 fL (ref 79–97)
Monocytes Absolute: 0.9 10*3/uL (ref 0.1–0.9)
Monocytes: 7 %
Neutrophils Absolute: 7.7 10*3/uL — ABNORMAL HIGH (ref 1.4–7.0)
Neutrophils: 57 %
Platelets: 402 10*3/uL (ref 150–450)
RBC: 4.52 x10E6/uL (ref 3.77–5.28)
RDW: 12.8 % (ref 11.7–15.4)
Retic Ct Pct: 1.3 % (ref 0.6–2.6)
Total Iron Binding Capacity: 312 ug/dL (ref 250–450)
UIBC: 252 ug/dL (ref 131–425)
Vitamin B-12: 331 pg/mL (ref 232–1245)
WBC: 13.5 10*3/uL — ABNORMAL HIGH (ref 3.4–10.8)

## 2023-04-17 LAB — VITAMIN D 25 HYDROXY (VIT D DEFICIENCY, FRACTURES): Vit D, 25-Hydroxy: 48.1 ng/mL (ref 30.0–100.0)

## 2023-04-17 LAB — MAGNESIUM: Magnesium: 1.9 mg/dL (ref 1.6–2.3)

## 2023-04-17 LAB — TSH: TSH: 1.6 u[IU]/mL (ref 0.450–4.500)

## 2023-04-17 NOTE — Addendum Note (Signed)
 Addended by: Neale Burly on: 04/17/2023 08:27 AM   Modules accepted: Orders

## 2023-04-17 NOTE — Progress Notes (Signed)
 WBC has remained elevated without smoking history, will refer to hematology for further evaluation. Slightly elevated alk phos. Not fasting on exam. All other labs normal.

## 2023-05-02 ENCOUNTER — Inpatient Hospital Stay: Attending: Oncology | Admitting: Oncology

## 2023-05-02 ENCOUNTER — Inpatient Hospital Stay

## 2023-05-02 VITALS — BP 127/88 | HR 80 | Temp 98.3°F | Resp 18 | Ht 64.0 in | Wt 198.0 lb

## 2023-05-02 DIAGNOSIS — Z87442 Personal history of urinary calculi: Secondary | ICD-10-CM | POA: Diagnosis not present

## 2023-05-02 DIAGNOSIS — M2560 Stiffness of unspecified joint, not elsewhere classified: Secondary | ICD-10-CM | POA: Insufficient documentation

## 2023-05-02 DIAGNOSIS — R7989 Other specified abnormal findings of blood chemistry: Secondary | ICD-10-CM | POA: Insufficient documentation

## 2023-05-02 DIAGNOSIS — Z79899 Other long term (current) drug therapy: Secondary | ICD-10-CM | POA: Diagnosis not present

## 2023-05-02 DIAGNOSIS — R61 Generalized hyperhidrosis: Secondary | ICD-10-CM | POA: Insufficient documentation

## 2023-05-02 DIAGNOSIS — D72820 Lymphocytosis (symptomatic): Secondary | ICD-10-CM

## 2023-05-02 DIAGNOSIS — D72829 Elevated white blood cell count, unspecified: Secondary | ICD-10-CM | POA: Diagnosis present

## 2023-05-02 LAB — CBC WITH DIFFERENTIAL/PLATELET
Abs Immature Granulocytes: 0.03 10*3/uL (ref 0.00–0.07)
Basophils Absolute: 0.1 10*3/uL (ref 0.0–0.1)
Basophils Relative: 1 %
Eosinophils Absolute: 0.4 10*3/uL (ref 0.0–0.5)
Eosinophils Relative: 5 %
HCT: 40.9 % (ref 36.0–46.0)
Hemoglobin: 13.6 g/dL (ref 12.0–15.0)
Immature Granulocytes: 0 %
Lymphocytes Relative: 34 %
Lymphs Abs: 3.3 10*3/uL (ref 0.7–4.0)
MCH: 30.7 pg (ref 26.0–34.0)
MCHC: 33.3 g/dL (ref 30.0–36.0)
MCV: 92.3 fL (ref 80.0–100.0)
Monocytes Absolute: 0.7 10*3/uL (ref 0.1–1.0)
Monocytes Relative: 7 %
Neutro Abs: 5.3 10*3/uL (ref 1.7–7.7)
Neutrophils Relative %: 53 %
Platelets: 383 10*3/uL (ref 150–400)
RBC: 4.43 MIL/uL (ref 3.87–5.11)
RDW: 13.2 % (ref 11.5–15.5)
WBC: 9.9 10*3/uL (ref 4.0–10.5)
nRBC: 0 % (ref 0.0–0.2)

## 2023-05-02 LAB — LACTATE DEHYDROGENASE: LDH: 122 U/L (ref 98–192)

## 2023-05-02 LAB — C-REACTIVE PROTEIN: CRP: 0.8 mg/dL (ref <1.0)

## 2023-05-02 LAB — SEDIMENTATION RATE: Sed Rate: 43 mm/h — ABNORMAL HIGH (ref 0–22)

## 2023-05-02 LAB — URIC ACID: Uric Acid, Serum: 6.3 mg/dL (ref 2.5–7.1)

## 2023-05-02 NOTE — Patient Instructions (Signed)
 VISIT SUMMARY:  During today's visit, we discussed your persistently elevated white blood cell count, which was first noted in 2019. You shared your history of urinary tract infections, occasional night sweats, and feeling full quickly after eating. We also reviewed your family history of rheumatoid arthritis and osteoporosis, as well as the significant stress in your life.  YOUR PLAN:  -LEUKOCYTOSIS: Leukocytosis means having a higher than normal white blood cell count, which can be due to various reasons such as infections, inflammation, or blood disorders. We will order blood tests to check for inflammation and leukemia, and perform a rheumatological workup due to your elevated ferritin levels and family history.  INSTRUCTIONS:  Please schedule a follow-up appointment in two weeks to discuss the results of your blood work. If no concerning findings are noted, we may consider annual or biannual follow-ups.

## 2023-05-03 LAB — RHEUMATOID FACTOR: Rheumatoid fact SerPl-aCnc: <10 [IU]/mL (ref <14.0)

## 2023-05-05 DIAGNOSIS — D72829 Elevated white blood cell count, unspecified: Secondary | ICD-10-CM | POA: Insufficient documentation

## 2023-05-05 LAB — CYCLIC CITRUL PEPTIDE ANTIBODY, IGG/IGA: CCP Antibodies IgG/IgA: 5 U (ref 0–19)

## 2023-05-05 NOTE — Assessment & Plan Note (Signed)
 Chronic leukocytosis since 2019 with temporary decrease in 2023. Differential includes infection, inflammation, medication effects, and hematological disorders like CLL.  No B symptoms at this time.  Elevated ferritin suggests inflammation, warranting rheumatological workup especially also with a family history. - Order blood work for inflammatory markers and flow cytometry. - Perform rheumatological workup due to elevated ferritin and family history.  Return to clinic in 2 weeks to discuss results

## 2023-05-05 NOTE — Progress Notes (Signed)
 [facility] at [facility] HEMATOLOGY NEW VISIT  Milian, [name], FNP  REASON FOR REFERRAL: Leukocytosis  HISTORY OF PRESENT ILLNESS: Holly Petersen 63 y.o. female referred for persistent leukocytosis.She denies any recent infections, but recalls a severe illness last year, characterized by high fevers, chills, and profuse sweating, which lasted for a week. She was subsequently diagnosed with a urinary tract infection (UTI) and treated with antibiotics. A few months later, she experienced similar symptoms and was diagnosed with another UTI. She also reports a history of hematuria, for which she underwent extensive testing, but no cause was identified. She has a remote history of kidney stones, but none recently. She also reports occasional night sweats, but denies any recent fevers, chills, or unintentional weight loss. She has noticed feeling full quickly after eating, but her appetite is otherwise normal. She reports some joint stiffness, but denies any joint pain or rashes.   She denies smoking, denies alcohol use.  She has a family history of rheumatoid arthritis and osteoporosis. She also reports a significant amount of stress in her life, including the loss of a grandchild, caring for aging parents, and a demanding job.  She lives with her husband in [place].  She works in HR.   I have reviewed the past medical history, past surgical history, social history and family history with the patient   ALLERGIES:  is allergic to ciprofloxacin, levaquin [levofloxacin], and other.  MEDICATIONS:  Current Outpatient Medications  Medication Sig Dispense Refill   Ascorbic Acid (VITAMIN C) 100 MG tablet Take 100 mg by mouth daily.     benzonatate (TESSALON PERLES) 100 MG capsule Take 1 capsule (100 mg total) by mouth 3 (three) times daily as needed. 20 capsule 0   cholecalciferol (VITAMIN D3) 25 MCG (1000 UNIT) tablet Take 1,000 Units by mouth daily.      hydrochlorothiazide (HYDRODIURIL) 25 MG tablet Take 1 tablet (25 mg total) by mouth daily. 90 tablet 0   Multiple Vitamins-Minerals (MULTIVITAMIN WITH MINERALS) tablet Take 1 tablet by mouth daily.     Quercetin 500 MG CAPS      No current facility-administered medications for this visit.     REVIEW OF SYSTEMS:   Constitutional: Denies fevers, chills or night sweats Eyes: Denies blurriness of vision Ears, nose, mouth, throat, and face: Denies mucositis or sore throat Respiratory: Denies cough, dyspnea or wheezes Cardiovascular: Denies palpitation, chest discomfort or lower extremity swelling Gastrointestinal:  Denies nausea, heartburn or change in bowel habits Skin: Denies abnormal skin rashes Lymphatics: Denies new lymphadenopathy or easy bruising Neurological:Denies numbness, tingling or new weaknesses Behavioral/Psych: Mood is stable, no new changes  All other systems were reviewed with the patient and are negative.  PHYSICAL EXAMINATION:   Vitals:   05/02/23 1003  BP: 127/88  Pulse: 80  Resp: 18  Temp: 98.3 F (36.8 C)  SpO2: 100%    GENERAL:alert, no distress and comfortable LYMPH:  no palpable lymphadenopathy in the cervical, axillary or inguinal LUNGS: clear to auscultation and percussion with normal breathing effort HEART: regular rate & rhythm and no murmurs and no lower extremity edema ABDOMEN:abdomen soft, non-tender and normal bowel sounds Musculoskeletal:no cyanosis of digits and no clubbing  NEURO: alert & oriented x 3 with fluent speech  LABORATORY DATA:  I have reviewed the data as listed  Lab Results  Component Value Date   WBC 9.9 05/02/2023   NEUTROABS 5.3 05/02/2023   HGB 13.6 05/02/2023   HCT 40.9 05/02/2023  MCV 92.3 05/02/2023   PLT 383 05/02/2023       Chemistry      Component Value Date/Time   NA 142 04/16/2023 1633   K 4.2 04/16/2023 1633   CL 99 04/16/2023 1633   CO2 25 04/16/2023 1633   BUN 12 04/16/2023 1633   CREATININE  0.75 04/16/2023 1633      Component Value Date/Time   CALCIUM 9.7 04/16/2023 1633   ALKPHOS 134 (H) 04/16/2023 1633   AST 19 04/16/2023 1633   ALT 17 04/16/2023 1633   BILITOT 0.3 04/16/2023 1633      Latest Reference Range & Units 10/14/08 06:33 07/12/16 10:33 03/13/17 13:15 02/27/18 15:25 08/16/21 13:00 04/14/22 21:04 03/20/23 15:40 04/16/23 16:33   WBC 4.0 - 10.5 K/uL 8.4 8.6 10.9 (H) 11.1 (H) 9.8 15.7 (H) 11.4 (H) 13.5 (H)   (H): Data is abnormally high ASSESSMENT & PLAN:  Patient is a 63 year old female referred for leukocytosis   Leukocytosis Chronic leukocytosis since 2019 with temporary decrease in 2023. Differential includes infection, inflammation, medication effects, and hematological disorders like CLL.  No B symptoms at this time.  Elevated ferritin suggests inflammation, warranting rheumatological workup especially also with a family history. - Order blood work for inflammatory markers and flow cytometry. - Perform rheumatological workup due to elevated ferritin and family history.  Return to clinic in 2 weeks to discuss results   Orders Placed This Encounter  Procedures   CBC with Differential/Platelet    Standing Status:   Future    Number of Occurrences:   1    Expected Date:   05/02/2023    Expiration Date:   05/01/2024   Uric acid    Standing Status:   Future    Number of Occurrences:   1    Expected Date:   05/02/2023    Expiration Date:   05/01/2024   Lactate dehydrogenase    Standing Status:   Future    Number of Occurrences:   1    Expected Date:   05/02/2023    Expiration Date:   05/01/2024   Sedimentation rate    Standing Status:   Future    Number of Occurrences:   1    Expected Date:   05/02/2023    Expiration Date:   05/01/2024   C-reactive protein    Standing Status:   Future    Number of Occurrences:   1    Expected Date:   05/02/2023    Expiration Date:   05/01/2024   Flow Cytometry, Peripheral Blood (Oncology)    Standing Status:   Future     Number of Occurrences:   1    Expected Date:   05/02/2023    Expiration Date:   05/01/2024   ANA, IFA (with reflex)    Standing Status:   Future    Number of Occurrences:   1    Expected Date:   05/02/2023    Expiration Date:   05/01/2024   Rheumatoid factor    Standing Status:   Future    Number of Occurrences:   1    Expected Date:   05/02/2023    Expiration Date:   05/01/2024   Cyclic Citrul Peptide Antibody, IGG/IGA    Standing Status:   Future    Number of Occurrences:   1    Expected Date:   05/02/2023    Expiration Date:   05/01/2024    The total time spent in the appointment  was 40 minutes encounter with patients including review of chart and various tests results, discussions about plan of care and coordination of care plan   All questions were answered. The patient knows to call the clinic with any problems, questions or concerns. No barriers to learning was detected.   [name], MD 3/24/20259:12 AM

## 2023-05-06 LAB — SURGICAL PATHOLOGY

## 2023-05-08 LAB — FLOW CYTOMETRY

## 2023-05-13 ENCOUNTER — Encounter: Payer: Self-pay | Admitting: Family Medicine

## 2023-05-13 LAB — ANTINUCLEAR ANTIBODIES, IFA: ANA Ab, IFA: NEGATIVE

## 2023-05-13 NOTE — Progress Notes (Signed)
 Patient had some ectopy on exam. Recommend patient increase hydration to at least 80-100 oz of water daily. Avoid stimulants such as caffeine. Will refer to cardiology for further evaluation.

## 2023-05-13 NOTE — Addendum Note (Signed)
 Addended by: Neale Burly on: 05/13/2023 08:08 AM   Modules accepted: Orders

## 2023-05-16 ENCOUNTER — Inpatient Hospital Stay: Attending: Oncology | Admitting: Oncology

## 2023-05-16 VITALS — BP 131/88 | HR 79 | Temp 98.1°F | Resp 18 | Ht 64.0 in | Wt 200.6 lb

## 2023-05-16 DIAGNOSIS — R700 Elevated erythrocyte sedimentation rate: Secondary | ICD-10-CM | POA: Insufficient documentation

## 2023-05-16 DIAGNOSIS — R7989 Other specified abnormal findings of blood chemistry: Secondary | ICD-10-CM | POA: Diagnosis not present

## 2023-05-16 DIAGNOSIS — Z79899 Other long term (current) drug therapy: Secondary | ICD-10-CM | POA: Insufficient documentation

## 2023-05-16 DIAGNOSIS — Z8349 Family history of other endocrine, nutritional and metabolic diseases: Secondary | ICD-10-CM | POA: Diagnosis not present

## 2023-05-16 DIAGNOSIS — D72829 Elevated white blood cell count, unspecified: Secondary | ICD-10-CM | POA: Diagnosis present

## 2023-05-16 NOTE — Assessment & Plan Note (Signed)
 Chronic leukocytosis since 2019 with temporary decrease in 2023.  Resolved on previous lab check.  Likely secondary to recurrent UTIs.  Flow cytometry: Negative.  Slightly elevated ESR, normal CRP, LDH, uric acid.  Rheumatological workup negative.  No B symptoms. -Discussed with patient that her leukocytosis was likely secondary to chronic UTIs. -Continue to monitor with PCP  No further hematological needs at this time.  Will discharge to primary care.  Recommended patient to reach out to [place] if she has any questions or concerns in future

## 2023-05-16 NOTE — Patient Instructions (Signed)
 VISIT SUMMARY:  You came in today for a follow-up after your recent abnormal lab results. We discussed your history of chronic urinary tract infections (UTIs), and you mentioned that you have not had any recent UTIs. You also shared that you wore a heart monitor for a couple of weeks and are waiting for a referral to an eye specialist. 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  YOUR PLAN:  -ELEVATED FERRITIN LEVEL: Ferritin is a protein that stores iron in your body, and your level is currently elevated at 325. Although you are not experiencing any symptoms, we will repeat the ferritin level in six months to monitor it. Your primary care physician will also keep an eye on your ferritin levels and refer you back to [place] if there are any concerns.  -ELEVATED WHITE BLOOD CELL COUNT: Resolved  INSTRUCTIONS:  Please repeat the ferritin level test in six months. Your primary care physician will monitor your ferritin levels and refer you back to [place] if needed.

## 2023-05-16 NOTE — Progress Notes (Signed)
 [facility] at [facility]  HEMATOLOGY FOLLOW-UP VISIT  Holly Petersen, Holly Campi, FNP  REASON FOR FOLLOW-UP: Leukocytosis  ASSESSMENT & PLAN:  Patient is a 63 y.o. female following for leukocytosis  Leukocytosis Chronic leukocytosis since 2019 with temporary decrease in 2023.  Resolved on previous lab check.  Likely secondary to recurrent UTIs.  Flow cytometry: Negative.  Slightly elevated ESR, normal CRP, LDH, uric acid.  Rheumatological workup negative.  No B symptoms. -Discussed with patient that her leukocytosis was likely secondary to chronic UTIs. -Continue to monitor with PCP  No further hematological needs at this time.  Will discharge to primary care.  Recommended patient to reach out to [place] if she has any questions or concerns in future  Elevated ferritin Patient has slightly elevated ferritin levels on previous lab check.  Likely secondary to recurrent UTIs as well.  Rheumatological workup negative.  Also has slightly elevated ESR. -Continue to monitor with primary care. -Family history of hemochromatosis, low likelihood of the disease.  No further workup needed at this time   No further hematological needs at this time.  Will discharge to primary care.  The total time spent in the appointment was 20 minutes encounter with patients including review of chart and various tests results, discussions about plan of care and coordination of care plan   All questions were answered. The patient knows to call the clinic with any problems, questions or concerns. No barriers to learning was detected.  Holly Crumbly, MD 4/4/202511:56 AM   INTERVAL HISTORY: Holly Petersen 63 y.o. female following for leukocytosis.  Patient has no complaints today.  Has been doing really well.  Denies fatigue, night sweats, fever, chills, loss of weight, loss of appetite.  We discussed that her labs showed resolution of leukocytosis and does not need further workup.  We  discussed discharging from the clinic and following primary care.  Patient is agreeable to this.  I have reviewed the past medical history, past surgical history, social history and family history with the patient   ALLERGIES:  is allergic to ciprofloxacin, levaquin [levofloxacin], and other.  MEDICATIONS:  Current Outpatient Medications  Medication Sig Dispense Refill   Ascorbic Acid (VITAMIN C) 100 MG tablet Take 100 mg by mouth daily.     cholecalciferol (VITAMIN D3) 25 MCG (1000 UNIT) tablet Take 1,000 Units by mouth daily.     hydrochlorothiazide (HYDRODIURIL) 25 MG tablet Take 1 tablet (25 mg total) by mouth daily. 90 tablet 0   Multiple Vitamins-Minerals (MULTIVITAMIN WITH MINERALS) tablet Take 1 tablet by mouth daily.     Quercetin 500 MG CAPS      No current facility-administered medications for this visit.     REVIEW OF SYSTEMS:   Constitutional: Denies fevers, chills or night sweats Eyes: Denies blurriness of vision Ears, nose, mouth, throat, and face: Denies mucositis or sore throat Respiratory: Denies cough, dyspnea or wheezes Cardiovascular: Denies palpitation, chest discomfort or lower extremity swelling Gastrointestinal:  Denies nausea, heartburn or change in bowel habits Skin: Denies abnormal skin rashes Lymphatics: Denies new lymphadenopathy or easy bruising Neurological:Denies numbness, tingling or new weaknesses Behavioral/Psych: Mood is stable, no new changes  All other systems were reviewed with the patient and are negative.  PHYSICAL EXAMINATION:   Vitals:   05/16/23 0826  BP: 131/88  Pulse: 79  Resp: 18  Temp: 98.1 F (36.7 C)  SpO2: 100%    GENERAL:alert, no distress and comfortable SKIN: skin color, texture, turgor are normal, no rashes  or significant lesions LYMPH:  no palpable lymphadenopathy in the cervical, axillary or inguinal LUNGS: clear to auscultation and percussion with normal breathing effort HEART: regular rate & rhythm and no  murmurs and no lower extremity edema ABDOMEN:abdomen soft, non-tender and normal bowel sounds Musculoskeletal:no cyanosis of digits and no clubbing  NEURO: alert & oriented x 3 with fluent speech  LABORATORY DATA:  I have reviewed the data as listed  Lab Results  Component Value Date   WBC 9.9 05/02/2023   NEUTROABS 5.3 05/02/2023   HGB 13.6 05/02/2023   HCT 40.9 05/02/2023   MCV 92.3 05/02/2023   PLT 383 05/02/2023       Chemistry      Component Value Date/Time   NA 142 04/16/2023 1633   K 4.2 04/16/2023 1633   CL 99 04/16/2023 1633   CO2 25 04/16/2023 1633   BUN 12 04/16/2023 1633   CREATININE 0.75 04/16/2023 1633      Component Value Date/Time   CALCIUM 9.7 04/16/2023 1633   ALKPHOS 134 (H) 04/16/2023 1633   AST 19 04/16/2023 1633   ALT 17 04/16/2023 1633   BILITOT 0.3 04/16/2023 1633      Latest Reference Range & Units 05/02/23 10:30  Uric Acid, Serum 2.5 - 7.1 mg/dL 6.3  LDH 98 - 161 U/L 096    Latest Reference Range & Units 04/16/23 16:33 05/02/23 10:30  Iron 27 - 159 ug/dL 60   UIBC 045 - 409 ug/dL 811   TIBC 914 - 782 ug/dL 956   Ferritin 15 - 213 ng/mL 325 (H)   Iron Saturation 15 - 55 % 19   Folate >3.0 ng/mL 13.4   CRP <1.0 mg/dL  0.8  Vitamin D, 08-MVHQION 30.0 - 100.0 ng/mL 48.1   Vitamin B12 232 - 1,245 pg/mL 331   (H): Data is abnormally high   Latest Reference Range & Units 05/02/23 10:30  Sed Rate 0 - 22 mm/hr 43 (H)  ANA Ab, IFA  Negative  CCP Antibodies IgG/IgA 0 - 19 units 5  RA Latex Turbid. <14.0 IU/mL <10.0  (H): Data is abnormally high   Latest Reference Range & Units 05/02/23 10:30  CCP Antibodies IgG/IgA 0 - 19 units 5   Flow cytometry: 05/06/23  DIAGNOSIS:   Peripheral blood, flow cytometry:  -  No immunophenotypic evidence of a lymphoproliferative disorder (i.e.  no monoclonal B cells or immunophenotypically abnormal T cells  detected).   GATING AND PHENOTYPIC ANALYSIS:   Gated population: Flow cytometric  immunophenotyping is performed using  antibodies to the antigens listed in the table below. Electronic gates  are placed around a cell cluster displaying light scatter properties  corresponding to: lymphocytes   Abnormal Cells in gated population: N/A   Phenotype of Abnormal Cells: N/A

## 2023-05-16 NOTE — Assessment & Plan Note (Signed)
 Patient has slightly elevated ferritin levels on previous lab check.  Likely secondary to recurrent UTIs as well.  Rheumatological workup negative.  Also has slightly elevated ESR. -Continue to monitor with primary care. -Family history of hemochromatosis, low likelihood of the disease.  No further workup needed at this time

## 2023-06-19 ENCOUNTER — Other Ambulatory Visit: Payer: Self-pay | Admitting: Family Medicine

## 2023-06-19 DIAGNOSIS — M8580 Other specified disorders of bone density and structure, unspecified site: Secondary | ICD-10-CM

## 2023-06-19 DIAGNOSIS — I10 Essential (primary) hypertension: Secondary | ICD-10-CM

## 2023-06-19 MED ORDER — HYDROCHLOROTHIAZIDE 25 MG PO TABS: 25.0000 mg | ORAL_TABLET | Freq: Every day | ORAL | 0 refills | Status: DC

## 2023-08-11 DIAGNOSIS — R002 Palpitations: Secondary | ICD-10-CM | POA: Insufficient documentation

## 2023-08-11 NOTE — Progress Notes (Unsigned)
 Cardiology Office Note   Date:  08/13/2023   ID:  Holly Petersen, DOB 1960-04-28, MRN [id number]  PCP:  Dettinger, Fonda LABOR, MD  Cardiologist:   Lynwood Schilling, MD Referring:  Dettinger, Fonda LABOR, MD  Chief Complaint  Patient presents with   Palpitations      History of Present Illness: Holly Petersen is a 63 y.o. female who presents for palpitations.   She is referred by Dettinger, Fonda LABOR, MD.   She said this has been going on since she started getting treatment for hypertension maybe 4 to 5 months ago.  Her blood pressure has been controlled.  She has not had any prior cardiac history before this.  She said she will feel her heart skipping occasionally.  At night she has woken up with heart racing but this was really 1 time.  She does not really notice her heart beating quickly if she is busy during the day.  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  The last time she did this she was fatigued.  A nurse who is a neighbor said she felt some palpitations.  She has been taking it easy since then and has a relatively sedentary job.  She says she cannot bring on any symptoms.  Her skipping happens sporadically.  She is otherwise felt okay although she has been under stress.  She did lose a 50 year old granddaughter to congenital heart disease.  She gets occasional shortness of breath with physical activity but usually can do her chores without shortness of breath or chest discomfort.  She does not have any PND or orthopnea.  She has not had any presyncope or syncope.  She wore a monitor that demonstrated sinus rhythm as rare SVT with the longest run being 14 beats.  She is otherwise not had any prior cardiac workup.   Past Medical History:  Diagnosis Date   HTN (hypertension)    Kidney stones     Past Surgical History:  Procedure Laterality Date   ABLATION     Uterine   GALLBLADDER SURGERY     right wrist surgery Right      Current Outpatient Medications   Medication Sig Dispense Refill   hydrochlorothiazide  (HYDRODIURIL ) 25 MG tablet Take 1 tablet (25 mg total) by mouth daily. 90 tablet 0   No current facility-administered medications for this visit.    Allergies:   Ciprofloxacin, Levaquin [levofloxacin], and Other    Social History:  The patient  reports that she has never smoked. She has never used smokeless tobacco. She reports that she does not drink alcohol and does not use drugs.   Family History:  The patient's family history includes Arthritis in her maternal grandmother; COPD in her mother; Diabetes in her daughter and paternal grandmother; Heart disease in her father; Hypertension in her mother; Thyroid disease in her brother, daughter, and mother.    ROS:  Please see the history of present illness.   Otherwise, review of systems are positive for none.   All other systems are reviewed and negative.    PHYSICAL EXAM: VS:  BP (!) 132/98   Pulse 73   Ht 5' 4 (1.626 m)   Wt 195 lb (88.5 kg)   BMI 33.47 kg/m  , BMI Body mass index is 33.47 kg/m. GENERAL:  Well appearing HEENT:  Pupils equal round and reactive, fundi not visualized, oral mucosa unremarkable NECK:  No jugular venous distention, waveform within normal limits, carotid upstroke brisk  and symmetric, no bruits, no thyromegaly LYMPHATICS:  No cervical, inguinal adenopathy LUNGS:  Clear to auscultation bilaterally BACK:  No CVA tenderness CHEST:  Unremarkable HEART:  PMI not displaced or sustained,S1 and S2 within normal limits, no S3, no S4, no clicks, no rubs, no murmurs ABD:  Flat, positive bowel sounds normal in frequency in pitch, no bruits, no rebound, no guarding, no midline pulsatile mass, no hepatomegaly, no splenomegaly EXT:  2 plus pulses throughout, no edema, no cyanosis no clubbing SKIN:  No rashes no nodules NEURO:  Cranial nerves II through XII grossly intact, motor grossly intact throughout [facility]:  Cognitively intact, oriented to person place and  time    EKG:  EKG Interpretation Date/Time:  Wednesday August 13 2023 11:13:40 EDT Ventricular Rate:  73 PR Interval:  152 QRS Duration:  98 QT Interval:  398 QTC Calculation: 438 R Axis:   -45  Text Interpretation: Normal sinus rhythm Left axis deviation Incomplete right bundle branch block Nonspecific T wave abnormality No previous ECGs available Confirmed by Lavona Agent ([id number]) on 08/13/2023 11:32:29 AM     Recent Labs: 04/16/2023: ALT 17; BUN 12; Creatinine, Ser 0.75; Magnesium 1.9; Potassium 4.2; Sodium 142; TSH 1.600 05/02/2023: Hemoglobin 13.6; Platelets 383    Lipid Panel    Component Value Date/Time   CHOL 263 (H) 03/20/2023 1540   TRIG 78 03/20/2023 1540   HDL 49 03/20/2023 1540   CHOLHDL 5.4 (H) 03/20/2023 1540   LDLCALC 201 (H) 03/20/2023 1540      Wt Readings from Last 3 Encounters:  08/13/23 195 lb (88.5 kg)  05/16/23 200 lb 9.6 oz (91 kg)  05/02/23 198 lb (89.8 kg)      Other studies Reviewed: Additional studies/ records that were reviewed today include: Labs, monitor. Review of the above records demonstrates:  Please see elsewhere in the note.     ASSESSMENT AND PLAN:  Palpitations: She is having some SVT and we talked about this.  She would prefer conservative management unless it becomes more problematic.  Of note she did have normal electrolytes and TSH.  Abnormal EKG: She does have some conduction disturbance as described but no symptoms really related to this.  I will check an echocardiogram.  Hypertension: Her blood pressure is controlled on the meds as listed.  She should continue this.  No change in therapy.   Current medicines are reviewed at length with the patient today.  The patient does not have concerns regarding medicines.  The following changes have been made:  no change  Labs/ tests ordered today include:   Orders Placed This Encounter  Procedures   EKG 12-Lead   ECHOCARDIOGRAM COMPLETE     Disposition:   FU with with me  as needed.     Signed, Agent Lavona, MD  08/13/2023 11:58 AM    [facility]

## 2023-08-13 ENCOUNTER — Encounter: Payer: Self-pay | Admitting: Cardiology

## 2023-08-13 ENCOUNTER — Ambulatory Visit: Admitting: Cardiology

## 2023-08-13 VITALS — BP 132/98 | HR 73 | Ht 64.0 in | Wt 195.0 lb

## 2023-08-13 DIAGNOSIS — R002 Palpitations: Secondary | ICD-10-CM | POA: Diagnosis not present

## 2023-08-13 DIAGNOSIS — R9431 Abnormal electrocardiogram [ECG] [EKG]: Secondary | ICD-10-CM | POA: Diagnosis not present

## 2023-08-13 NOTE — Patient Instructions (Addendum)
 Medication Instructions:   Continue all current medications.   Labwork:  none  Testing/Procedures:  Your physician has requested that you have an echocardiogram. Echocardiography is a painless test that uses sound waves to create images of your heart. It provides your doctor with information about the size and shape of your heart and how well your heart's chambers and valves are working. This procedure takes approximately one hour. There are no restrictions for this procedure. Please do NOT wear cologne, perfume, aftershave, or lotions (deodorant is allowed). Please arrive 15 minutes prior to your appointment time.  Please note: We ask at that you not bring children with you during ultrasound (echo/ vascular) testing. Due to room size and safety concerns, children are not allowed in the ultrasound rooms during exams. Our front office staff cannot provide observation of children in our lobby area while testing is being conducted. An adult accompanying a patient to their appointment will only be allowed in the ultrasound room at the discretion of the ultrasound technician under special circumstances. We apologize for any inconvenience. Office will contact with results via phone, letter or mychart.     Follow-Up:  As needed   Any Other Special Instructions Will Be Listed Below (If Applicable).   If you need a refill on your cardiac medications before your next appointment, please call your pharmacy.

## 2023-08-14 ENCOUNTER — Ambulatory Visit (HOSPITAL_COMMUNITY)
Admission: RE | Admit: 2023-08-14 | Discharge: 2023-08-14 | Disposition: A | Discharge status: Home / Self Care | Source: Ambulatory Visit | Attending: Cardiology | Admitting: Cardiology

## 2023-08-14 ENCOUNTER — Ambulatory Visit: Payer: Self-pay | Admitting: Cardiology

## 2023-08-14 DIAGNOSIS — R002 Palpitations: Secondary | ICD-10-CM | POA: Insufficient documentation

## 2023-08-14 DIAGNOSIS — R9431 Abnormal electrocardiogram [ECG] [EKG]: Secondary | ICD-10-CM | POA: Insufficient documentation

## 2023-08-14 LAB — ECHOCARDIOGRAM COMPLETE
Area-P 1/2: 3.37 cm2
S' Lateral: 2.9 cm

## 2023-08-14 NOTE — Progress Notes (Signed)
*  PRELIMINARY RESULTS* Echocardiogram 2D Echocardiogram has been performed.  Holly Petersen 08/14/2023, 12:28 PM

## 2023-08-27 ENCOUNTER — Other Ambulatory Visit: Payer: Self-pay

## 2023-08-27 DIAGNOSIS — I10 Essential (primary) hypertension: Secondary | ICD-10-CM

## 2023-08-27 DIAGNOSIS — M8580 Other specified disorders of bone density and structure, unspecified site: Secondary | ICD-10-CM

## 2023-08-27 MED ORDER — HYDROCHLOROTHIAZIDE 25 MG PO TABS: 25.0000 mg | ORAL_TABLET | Freq: Every day | ORAL | 0 refills | Status: DC

## 2023-10-10 ENCOUNTER — Ambulatory Visit: Admitting: Family Medicine

## 2023-10-10 ENCOUNTER — Encounter: Payer: Self-pay | Admitting: Family Medicine

## 2023-10-10 VITALS — BP 136/80 | HR 67 | Temp 97.1°F | Ht 64.0 in | Wt 185.0 lb

## 2023-10-10 DIAGNOSIS — M8580 Other specified disorders of bone density and structure, unspecified site: Secondary | ICD-10-CM | POA: Diagnosis not present

## 2023-10-10 DIAGNOSIS — I10 Essential (primary) hypertension: Secondary | ICD-10-CM | POA: Diagnosis not present

## 2023-10-10 DIAGNOSIS — E7800 Pure hypercholesterolemia, unspecified: Secondary | ICD-10-CM | POA: Diagnosis not present

## 2023-10-10 MED ORDER — HYDROCHLOROTHIAZIDE 25 MG PO TABS: 25.0000 mg | ORAL_TABLET | Freq: Every day | ORAL | 3 refills | Status: AC

## 2023-10-10 MED ORDER — HYDROCHLOROTHIAZIDE 25 MG PO TABS: 25.0000 mg | ORAL_TABLET | Freq: Every day | ORAL | 0 refills | Status: DC

## 2023-10-10 NOTE — Progress Notes (Signed)
 BP 136/80   Pulse 67   Temp (!) 97.1 F (36.2 C) (Temporal)   Ht 5' 4 (1.626 m)   Wt 185 lb (83.9 kg)   SpO2 98%   BMI 31.76 kg/m    Subjective:   Patient ID: Holly Petersen, female    DOB: [birth year], 63 y.o.   MRN: [id number]  HPI: Holly Petersen is a 63 y.o. female presenting on 10/10/2023 for Establish Care   Discussed the use of AI scribe software for clinical note transcription with the patient, who gave verbal consent to proceed.  History of Present Illness   Holly Petersen is a 63 year old female who presents for a routine follow-up and evaluation of a recent ankle injury.  She twisted her ankle after tripping over a broken brick while moving a spreader for fertilizer. Since then, she has experienced pain and bruising on the lateral side of her foot, describing the area as 'kind of purple'.  She has a history of heart palpitations and previously wore a heart monitor for a couple of weeks. A CT scan and EKG were performed, showing some irregularities, but the CT scan was reportedly normal. She is currently taking hydrochlorothiazide . Her blood pressure readings have varied, with some mornings showing readings as low as 90/70, leading her to skip her medication on those days. On other days, her blood pressure is around 120/70. She experiences occasional lightheadedness or dizziness when standing up quickly.  She follows a low-carb diet and avoids processed foods, aiming to improve her cholesterol levels without medication. She works long hours, sometimes only eating one meal a day, which she believes has slowed her metabolism. 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 Her father had a heart attack two years ago and a stroke earlier this year, now has memory issues and is on multiple medications. Her mother is physically less able to get around.        Relevant past medical, surgical, family and social history reviewed and updated as  indicated. Interim medical history since our last visit reviewed. Allergies and medications reviewed and updated.  Review of Systems  Constitutional:  Negative for chills and fever.  HENT:  Negative for congestion, ear discharge and ear pain.   Eyes:  Negative for redness and visual disturbance.  Respiratory:  Negative for chest tightness and shortness of breath.   Cardiovascular:  Negative for chest pain and leg swelling.  Genitourinary:  Negative for difficulty urinating and dysuria.  Musculoskeletal:  Positive for arthralgias (Recent ankle sprain and left ankle). Negative for back pain and gait problem.  Skin:  Negative for rash.  Neurological:  Negative for dizziness, light-headedness and headaches.  Psychiatric/Behavioral:  Negative for agitation and behavioral problems.   All other systems reviewed and are negative.   Per HPI unless specifically indicated above   Allergies as of 10/10/2023       Reactions   Ciprofloxacin Shortness Of Breath   Levaquin [levofloxacin] Shortness Of Breath   Other Shortness Of Breath   Flouroquinolone        Medication List        Accurate as of October 10, 2023  3:01 PM. If you have any questions, ask your nurse or doctor.          hydrochlorothiazide  25 MG tablet Commonly known as: HYDRODIURIL  Take 1 tablet (25 mg total) by mouth daily.         Objective:   BP 136/80  Pulse 67   Temp (!) 97.1 F (36.2 C) (Temporal)   Ht 5' 4 (1.626 m)   Wt 185 lb (83.9 kg)   SpO2 98%   BMI 31.76 kg/m   Wt Readings from Last 3 Encounters:  10/10/23 185 lb (83.9 kg)  08/13/23 195 lb (88.5 kg)  05/16/23 200 lb 9.6 oz (91 kg)    Physical Exam Vitals and nursing note reviewed.  Constitutional:      Appearance: Normal appearance. She is obese.  Neurological:     Mental Status: She is alert.    Physical Exam   CHEST: Lungs clear to auscultation bilaterally. CARDIOVASCULAR: Regular rate and rhythm, no murmurs. EXTREMITIES:  Bruising on the lateral side of the foot, twisted ankle.         Assessment & Plan:   Problem List Items Addressed This Visit       Cardiovascular and Mediastinum   Hypertension - Primary   Relevant Medications   hydrochlorothiazide  (HYDRODIURIL ) 25 MG tablet   Other Relevant Orders   CBC with Differential/Platelet   CMP14+EGFR   Lipid panel     Musculoskeletal and Integument   Osteopenia   Relevant Medications   hydrochlorothiazide  (HYDRODIURIL ) 25 MG tablet   Other Relevant Orders   CBC with Differential/Platelet   CMP14+EGFR   Lipid panel     Other   Pure hypercholesterolemia   Relevant Medications   hydrochlorothiazide  (HYDRODIURIL ) 25 MG tablet   Other Relevant Orders   CBC with Differential/Platelet   CMP14+EGFR   Lipid panel       Sprain of left ankle Left ankle sprain with bruising and lateral pain. No fracture. - Recommend daily toe raises to strengthen the ankle. - Advise stretching exercises for recovery.  Essential hypertension Blood pressure managed with hydrochlorothiazide . Occasional low readings and lightheadedness reported. - Instruct to skip hydrochlorothiazide  if BP is below 100 mmHg or if lightheaded. - Advise taking hydrochlorothiazide  if BP is over 130 mmHg. - Encourage maintaining hydration.  Hypercholesterolemia Elevated cholesterol managed with dietary modifications. No medication currently. - Order blood work to check cholesterol, kidney function, liver function, and blood sugar levels.          Follow up plan: Return in about 6 months (around 04/10/2024), or if symptoms worsen or fail to improve, for Hypertension and hyperlipidemia.  Counseling provided for all of the vaccine components Orders Placed This Encounter  Procedures   CBC with Differential/Platelet   CMP14+EGFR   Lipid panel    [name], MD [facility] 10/10/2023, 3:01 PM

## 2023-10-11 LAB — CBC WITH DIFFERENTIAL/PLATELET
Basophils Absolute: 0.1 x10E3/uL (ref 0.0–0.2)
Basos: 1 %
EOS (ABSOLUTE): 0.4 x10E3/uL (ref 0.0–0.4)
Eos: 4 %
Hematocrit: 40.2 % (ref 34.0–46.6)
Hemoglobin: 13.3 g/dL (ref 11.1–15.9)
Immature Grans (Abs): 0 x10E3/uL (ref 0.0–0.1)
Immature Granulocytes: 0 %
Lymphocytes Absolute: 3.4 x10E3/uL — ABNORMAL HIGH (ref 0.7–3.1)
Lymphs: 35 %
MCH: 31.1 pg (ref 26.6–33.0)
MCHC: 33.1 g/dL (ref 31.5–35.7)
MCV: 94 fL (ref 79–97)
Monocytes Absolute: 0.8 x10E3/uL (ref 0.1–0.9)
Monocytes: 8 %
Neutrophils Absolute: 5 x10E3/uL (ref 1.4–7.0)
Neutrophils: 52 %
Platelets: 336 x10E3/uL (ref 150–450)
RBC: 4.28 x10E6/uL (ref 3.77–5.28)
RDW: 13.1 % (ref 11.7–15.4)
WBC: 9.7 x10E3/uL (ref 3.4–10.8)

## 2023-10-11 LAB — CMP14+EGFR
ALT: 15 IU/L (ref 0–32)
AST: 16 IU/L (ref 0–40)
Albumin: 4 g/dL (ref 3.8–4.9)
Alkaline Phosphatase: 102 IU/L (ref 44–121)
BUN/Creatinine Ratio: 20 (ref 12–28)
BUN: 16 mg/dL (ref 8–27)
Bilirubin Total: 0.3 mg/dL (ref 0.0–1.2)
CO2: 25 mmol/L (ref 20–29)
Calcium: 9.1 mg/dL (ref 8.7–10.3)
Chloride: 101 mmol/L (ref 96–106)
Creatinine, Ser: 0.79 mg/dL (ref 0.57–1.00)
Globulin, Total: 2.8 g/dL (ref 1.5–4.5)
Glucose: 88 mg/dL (ref 70–99)
Potassium: 3.8 mmol/L (ref 3.5–5.2)
Sodium: 139 mmol/L (ref 134–144)
Total Protein: 6.8 g/dL (ref 6.0–8.5)
eGFR: 86 mL/min/1.73 (ref >59)

## 2023-10-11 LAB — LIPID PANEL
Chol/HDL Ratio: 4.9 ratio — ABNORMAL HIGH (ref 0.0–4.4)
Cholesterol, Total: 232 mg/dL — ABNORMAL HIGH (ref 100–199)
HDL: 47 mg/dL (ref >39)
LDL Chol Calc (NIH): 176 mg/dL — ABNORMAL HIGH (ref 0–99)
Triglycerides: 56 mg/dL (ref 0–149)
VLDL Cholesterol Cal: 9 mg/dL (ref 5–40)

## 2023-10-20 ENCOUNTER — Ambulatory Visit: Payer: Self-pay | Admitting: Family Medicine

## 2023-10-20 NOTE — Telephone Encounter (Signed)
 Tried calling patient to give her lab results/PCP recommendations but no answer and voicemail full.

## 2024-01-30 ENCOUNTER — Encounter: Payer: Self-pay | Admitting: Family Medicine

## 2024-01-30 ENCOUNTER — Ambulatory Visit: Admitting: Family Medicine

## 2024-01-30 VITALS — BP 161/84 | HR 67 | Ht 64.0 in | Wt 177.0 lb

## 2024-01-30 DIAGNOSIS — H04302 Unspecified dacryocystitis of left lacrimal passage: Secondary | ICD-10-CM

## 2024-01-30 MED ORDER — POLYMYXIN B-TRIMETHOPRIM 10000-0.1 UNIT/ML-% OP SOLN: 1.0000 [drp] | OPHTHALMIC | 0 refills | Status: AC

## 2024-01-30 NOTE — Progress Notes (Signed)
 "  BP (!) 161/84   Pulse 67   Ht 5' 4 (1.626 m)   Wt 177 lb (80.3 kg)   SpO2 98%   BMI 30.38 kg/m    Subjective:   Patient ID: Holly Petersen, female    DOB: February 06, 1961, 63 y.o.   MRN: [id number]  HPI: Holly Petersen is a 63 y.o. female presenting on 01/30/2024 for Eye Problem (left)   Discussed the use of AI scribe software for clinical note transcription with the patient, who gave verbal consent to proceed.  History of Present Illness   Holly Petersen is a 63 year old female who presents with left eye watering and irritation.  Ocular symptoms - Left eye watering for over one month, occurring during the day and upon waking - Left eye feels 'gunky' with episodes of redness, notably on a recent Sunday morning - Occasional itching and irritation of the left eye, without burning - Discharge present in the morning as crusting - No impact on vision - No recent trauma to the eye - Right eye has started to feel slightly bothersome  Self-management and prior interventions - Tried Pataday  and Visine without significant improvement - Warm compresses applied in the morning and at night - Removed an inward-growing eyelash from the left eye, without symptom relief         Relevant past medical, surgical, family and social history reviewed and updated as indicated. Interim medical history since our last visit reviewed. Allergies and medications reviewed and updated.  Review of Systems  Constitutional:  Negative for chills and fever.  Eyes:  Positive for discharge and itching. Negative for photophobia and visual disturbance.  Neurological:  Negative for light-headedness and headaches.  All other systems reviewed and are negative.   Per HPI unless specifically indicated above   Allergies as of 01/30/2024       Reactions   Ciprofloxacin Shortness Of Breath   Levaquin [levofloxacin] Shortness Of Breath   Other Shortness Of Breath   Flouroquinolone        Medication List         Accurate as of January 30, 2024  2:39 PM. If you have any questions, ask your nurse or doctor.          hydrochlorothiazide  25 MG tablet Commonly known as: HYDRODIURIL  Take 1 tablet (25 mg total) by mouth daily.   trimethoprim -polymyxin b  ophthalmic solution Commonly known as: POLYTRIM  Place 1 drop into the left eye every 4 (four) hours for 7 days. Started by: Holly Levins, MD         Objective:   BP (!) 161/84   Pulse 67   Ht 5' 4 (1.626 m)   Wt 177 lb (80.3 kg)   SpO2 98%   BMI 30.38 kg/m   Wt Readings from Last 3 Encounters:  01/30/24 177 lb (80.3 kg)  10/10/23 185 lb (83.9 kg)  08/13/23 195 lb (88.5 kg)    Physical Exam Vitals and nursing note reviewed.    Physical Exam   HEENT: Left eye duct appears plugged at the edge. No scratches or foreign bodies in the eyes.  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 No vision impairment or trauma. Symptoms align with duct obstruction. - Prescribed antibiotic eye drops. - Continue warm compresses 2-3 times daily. - Use Visine for symptomatic relief. - Discontinue Pataday  unless necessary. - Refer to ophthalmologist if symptoms persist.          Follow up plan: Return if symptoms worsen or fail to improve.  Counseling provided for all of the vaccine components No orders of the defined types were placed in this encounter.   Holly Levins, MD [facility] 01/30/2024, 2:39 PM     "

## 2024-04-02 ENCOUNTER — Ambulatory Visit: Payer: Self-pay | Admitting: Family Medicine
